# Patient Record
Sex: Female | Born: 1985 | Hispanic: No | Marital: Married | Smoking: Never smoker
Health system: Southern US, Community
[De-identification: ages and names within clinical notes are randomized; demographics above are authoritative.]

## PROBLEM LIST (undated history)

## (undated) DIAGNOSIS — Z789 Other specified health status: Secondary | ICD-10-CM

## (undated) DIAGNOSIS — R519 Headache, unspecified: Secondary | ICD-10-CM

## (undated) DIAGNOSIS — D649 Anemia, unspecified: Secondary | ICD-10-CM

## (undated) HISTORY — DX: Other specified health status: Z78.9

## (undated) HISTORY — PX: NO PAST SURGERIES: SHX2092

---

## 2018-10-03 NOTE — L&D Delivery Note (Addendum)
OB/GYN Faculty Practice Delivery Note  Sara George is a 33 y.o. G1P1001 s/p VAVD at [redacted]w[redacted]d. She was admitted for PROM followed by SOL.   ROM: 16h 62m with clear fluid initially then thick meconium prior to delivery in setting of Triple I GBS Status: positive  Maximum Maternal Temperature: Temp (48hrs), Avg:99.2 F (37.3 C), Min:97.3 F (36.3 C), Max:101.9 F (38.8 C)  Labor Progress: . Admitted with PROM around 10pm 04/09/19 . Augmentation with pitocin . Epidural placement . Triple I - ampicillin and gentamicin, Tylenol, fetal tachycardia noted . IUPC with amnioinfusion started for variable decelerations, fetal tachycardia . Progressed to complete, +1 station with persistent Category II tracing, presentation LOP . Counseled patient on risks/benefits of vacuum-assisted vaginal delivery and patient amenable to plan. Dr. Si Raider present in room for vacuum.  Delivery Date/Time: 04/10/19 at 1422 Delivery: Called to room and patient was complete and pushing. Pushed with patient for about 40 minutes - progressed from +1 to +2 station but with recurrent late/variable decelerations. Recommended vacuum-assisted delivery as noted above and patient in agreement. Mushroom vacuum placed with 1 pop-off noted but good fetal descent. Switched to bell vacuum and able to assist in turning infant to direct OA and pop-off at +3 station. Vacuum removed and patient was able to push and deliver infant with several subsequent contractions. Head delivered direct OA. One loose nuchal cord present, reduced after delivery. Shoulder and body delivered in usual fashion. Infant with spontaneous cry, placed on mother's abdomen, dried and stimulated. Cord clamped x 2 immediately given more respiratory effort and tone, and cut by provider. Infant handed over to awaiting neonatology team. Cord blood and ABG drawn. Placenta delivered spontaneously with gentle cord traction. Fundus firm with massage and Pitocin. Labia, perineum,  vagina, and cervix inspected with 2nd degree perineal laceration. IM methergine given prophylactically given higher PPH risk with triple I.   Placenta: spontaneous, intact, 3-vessel cord (to be sent to pathology given Triple I) Complications: thick meconium, laceration as below, triple I Lacerations: 2nd degree perineal repaired with 3-0 Vicryl and 4-0 Monocryl. Additional laceration repair completed by Dr. Si Raider.  EBL: 134cc per [facility] Analgesia: epidural  Postpartum Planning [x]  message to sent to schedule follow-up  [x]  vaccines UTD  Infant: Vigorous boy  APGARs 7, [place]. Juleen China, DO OB/GYN Fellow, Faculty Practice

## 2018-12-25 ENCOUNTER — Telehealth: Payer: Self-pay | Admitting: Medical

## 2018-12-25 ENCOUNTER — Other Ambulatory Visit (HOSPITAL_COMMUNITY)
Admission: RE | Admit: 2018-12-25 | Discharge: 2018-12-25 | Disposition: A | Discharge status: Home / Self Care | Payer: Medicaid Other | Source: Ambulatory Visit | Attending: Family Medicine | Admitting: Family Medicine

## 2018-12-25 ENCOUNTER — Ambulatory Visit (INDEPENDENT_AMBULATORY_CARE_PROVIDER_SITE_OTHER): Payer: Medicaid Other | Admitting: *Deleted

## 2018-12-25 ENCOUNTER — Other Ambulatory Visit: Payer: Self-pay

## 2018-12-25 VITALS — BP 118/68 | HR 93 | Ht 63.78 in | Wt 160.0 lb

## 2018-12-25 DIAGNOSIS — Z3400 Encounter for supervision of normal first pregnancy, unspecified trimester: Secondary | ICD-10-CM

## 2018-12-25 DIAGNOSIS — Z789 Other specified health status: Secondary | ICD-10-CM

## 2018-12-25 DIAGNOSIS — Z23 Encounter for immunization: Secondary | ICD-10-CM | POA: Diagnosis not present

## 2018-12-25 DIAGNOSIS — Z603 Acculturation difficulty: Secondary | ICD-10-CM

## 2018-12-25 NOTE — Telephone Encounter (Signed)
Called patient with interpreter, but did not get an answer.

## 2018-12-25 NOTE — Progress Notes (Signed)
New Ob intake completed and pregnancy information packet given. Interpreter [place] present for encounter however pt speaks and understands English quite well. Entire interview was performed in [place]. Pt states she would feel more comfortable having interpreter available for visits in case needed. Pt has received some prenatal care in [place] and has limited information available on her phone - nothing printed. Labs done and [place] for anatomy scheduled. Initial prenatal visit w/provider scheduled on 4/7.

## 2018-12-26 ENCOUNTER — Encounter: Payer: Self-pay | Admitting: Medical

## 2018-12-26 DIAGNOSIS — O99019 Anemia complicating pregnancy, unspecified trimester: Secondary | ICD-10-CM | POA: Insufficient documentation

## 2018-12-26 LAB — GC/CHLAMYDIA PROBE AMP (~~LOC~~) NOT AT ARMC
Chlamydia: NEGATIVE
Neisseria Gonorrhea: NEGATIVE

## 2018-12-26 LAB — OBSTETRIC PANEL, INCLUDING HIV
Antibody Screen: NEGATIVE
BASOS: 0 %
Basophils Absolute: 0 10*3/uL (ref 0.0–0.2)
EOS (ABSOLUTE): 0.1 10*3/uL (ref 0.0–0.4)
Eos: 1 %
HIV Screen 4th Generation wRfx: NONREACTIVE
Hematocrit: 28.5 % — ABNORMAL LOW (ref 34.0–46.6)
Hemoglobin: 9.7 g/dL — ABNORMAL LOW (ref 11.1–15.9)
Hepatitis B Surface Ag: NEGATIVE
Immature Grans (Abs): 0.1 10*3/uL (ref 0.0–0.1)
Immature Granulocytes: 1 %
Lymphocytes Absolute: 2 10*3/uL (ref 0.7–3.1)
Lymphs: 21 %
MCH: 27.6 pg (ref 26.6–33.0)
MCHC: 34 g/dL (ref 31.5–35.7)
MCV: 81 fL (ref 79–97)
Monocytes Absolute: 0.9 10*3/uL (ref 0.1–0.9)
Monocytes: 10 %
Neutrophils Absolute: 6.5 10*3/uL (ref 1.4–7.0)
Neutrophils: 67 %
Platelets: 227 10*3/uL (ref 150–450)
RBC: 3.52 x10E6/uL — ABNORMAL LOW (ref 3.77–5.28)
RDW: 14 % (ref 11.7–15.4)
RPR Ser Ql: NONREACTIVE
Rh Factor: POSITIVE
Rubella Antibodies, IGG: 15.1 index (ref >0.99)
WBC: 9.6 10*3/uL (ref 3.4–10.8)

## 2018-12-27 DIAGNOSIS — Z789 Other specified health status: Secondary | ICD-10-CM | POA: Insufficient documentation

## 2018-12-27 LAB — CULTURE, OB URINE

## 2018-12-27 LAB — URINE CULTURE, OB REFLEX

## 2018-12-28 NOTE — Progress Notes (Signed)
I have reviewed the chart and agree with nursing staff's documentation of this patient's encounter.  Vonzella Nipple, PA-C 12/28/2018 8:46 AM

## 2019-01-01 ENCOUNTER — Telehealth: Payer: Self-pay | Admitting: Family Medicine

## 2019-01-01 NOTE — Telephone Encounter (Signed)
Left a message about appt change due to the office wont be open

## 2019-01-07 ENCOUNTER — Ambulatory Visit (INDEPENDENT_AMBULATORY_CARE_PROVIDER_SITE_OTHER): Payer: Medicaid Other | Admitting: Advanced Practice Midwife

## 2019-01-07 ENCOUNTER — Other Ambulatory Visit (HOSPITAL_COMMUNITY)
Admission: RE | Admit: 2019-01-07 | Discharge: 2019-01-07 | Disposition: A | Discharge status: Home / Self Care | Payer: Medicaid Other | Source: Ambulatory Visit | Attending: Medical | Admitting: Medical

## 2019-01-07 ENCOUNTER — Other Ambulatory Visit: Payer: Self-pay

## 2019-01-07 ENCOUNTER — Encounter: Payer: Self-pay | Admitting: Advanced Practice Midwife

## 2019-01-07 VITALS — BP 110/67 | HR 89 | Temp 98.7°F | Wt 158.6 lb

## 2019-01-07 DIAGNOSIS — Z3A26 26 weeks gestation of pregnancy: Secondary | ICD-10-CM

## 2019-01-07 DIAGNOSIS — Z3402 Encounter for supervision of normal first pregnancy, second trimester: Secondary | ICD-10-CM | POA: Insufficient documentation

## 2019-01-07 DIAGNOSIS — Z23 Encounter for immunization: Secondary | ICD-10-CM

## 2019-01-07 MED ORDER — FAMOTIDINE 40 MG PO TABS: 40.0000 mg | ORAL_TABLET | Freq: Every day | ORAL | 11 refills | Status: DC

## 2019-01-07 MED ORDER — DOCUSATE SODIUM 100 MG PO CAPS: 100.0000 mg | ORAL_CAPSULE | Freq: Two times a day (BID) | ORAL | 11 refills | Status: DC | PRN

## 2019-01-07 MED ORDER — PRENATAL VITAMIN PLUS LOW IRON 27-1 MG PO TABS: 1.0000 | ORAL_TABLET | Freq: Every day | ORAL | 11 refills | Status: AC

## 2019-01-07 MED ORDER — FERROUS SULFATE 325 (65 FE) MG PO TABS: 325.0000 mg | ORAL_TABLET | Freq: Two times a day (BID) | ORAL | 11 refills | Status: AC

## 2019-01-07 NOTE — Progress Notes (Signed)
Medicaid Home form Completed-01/07/2019

## 2019-01-07 NOTE — Progress Notes (Signed)
Patient ID: Sara George, female   DOB: 1985-10-20, 33 y.o.   MRN: [id number]  Subjective:   Sara George is a 33 y.o. G1P0 at [redacted]w[redacted]d by LMP being seen today for her first obstetrical visit.  Her obstetrical history is significant for none. Patient does intend to breast feed. Pregnancy history fully reviewed.  Patient reports heartburn and vaginal irritation.  HISTORY: OB History  Gravida Para Term Preterm AB Living  1 0 0 0 0 0  SAB TAB Ectopic Multiple Live Births  0 0 0 0 0    # Outcome Date GA Lbr Len/2nd Weight Sex Delivery Anes PTL Lv  1 Current             Last pap smear was done never according to patient.    Past Medical History:  Diagnosis Date  . Medical history non-contributory    Past Surgical History:  Procedure Laterality Date  . NO PAST SURGERIES     Family History  Problem Relation Age of Onset  . Diabetes Mother   . Hypertension Mother   . Stroke Father    Social History   Tobacco Use  . Smoking status: Never Smoker  . Smokeless tobacco: Never Used  Substance Use Topics  . Alcohol use: Never    Frequency: Never  . Drug use: Never   No Known Allergies Current Outpatient Medications on File Prior to Visit  Medication Sig Dispense Refill  . Prenatal Vit-Fe Fumarate-FA (PRENATAL VITAMINS) 28-0.8 MG TABS Take by mouth.     No current facility-administered medications on file prior to visit.     Review of Systems Pertinent items noted in HPI and remainder of comprehensive ROS otherwise negative.  Exam   Vitals:   01/07/19 1402  BP: 110/67  Pulse: 89  Temp: 98.7 F (37.1 C)  Weight: 158 lb 9.6 oz (71.9 kg)   Fetal Heart Rate (bpm): 146  Physical Exam Vitals signs and nursing note reviewed. Exam conducted with a chaperone present.  Constitutional:      General: She is not in acute distress. HENT:     Head: Normocephalic.  Cardiovascular:     Rate and Rhythm: Normal rate.  Pulmonary:     Effort: Pulmonary effort is normal.   Abdominal:     Comments: Gravid FH 26 cm   Genitourinary:    Comments: External: no lesion Vagina: small amount of white discharge Cervix: pink, smooth, no CMT Uterus: AGA  Neurological:     General: No focal deficit present.     Mental Status: She is alert.  Psychiatric:        Mood and Affect: Mood normal.     Assessment:   Pregnancy: G1P0 Patient Active Problem List   Diagnosis Date Noted  . Language barrier 12/27/2018  . Anemia affecting pregnancy 12/26/2018  . Supervision of low-risk first pregnancy 12/25/2018     Plan:  1. Encounter for supervision of low-risk first pregnancy in second trimester  - Inheritest(R) CF/SMA Panel - Tdap vaccine greater than or equal to 7yo IM - Genetic Screening - Cytology - PAP( [place])   - Cervicovaginal ancillary only( [place]) - RX provided today for prenatal vitamins, iron supplement, colace and pepcid - Will treat vulvar irritation based on results from wet prep, pt ok with this plan.    Initial labs reviewed  Continue prenatal vitamins. Genetic Screening discussed, NIPS: ordered. Ultrasound discussed; fetal anatomic survey: ordered. Problem list reviewed and updated. The nature of [facility] with multiple MDs and other Advanced Practice Providers was explained to patient; also emphasized that residents, students are part of our team. Routine obstetric precautions reviewed. 50% of 45 min visit spent in counseling and coordination of care. Return in about 3 weeks (around 01/28/2019) for GTT and 28 week labs .   Thressa Sheller DNP, CNM  01/07/19  3:08 PM

## 2019-01-07 NOTE — Patient Instructions (Signed)
AREA PEDIATRIC/FAMILY PRACTICE PHYSICIANS  [place] ([address]) . [facility] o Chambliss, MD; Eniola, MD; Hale, MD; Hensel, MD; McDiarmid, MD; McIntyer, MD; Gene Glazebrook, MD; Walden, MD o [address] [place], [place] [address] o [phone] o Mon-Fri 8:30-12:30, 1:30-5:00 o Providers come to see babies at [facility] o Accepting Medicaid . [facility] o Limited providers who accept newborns: Koirala, MD; Morrow, MD; Wolters, MD o [address], [place], [place] [address] o [phone] o Mon-Fri 8:00-5:30 o Babies seen by providers at [facility] o Does NOT accept Medicaid o Please call early in hospitalization for appointment (limited availability)  . [facility] o Mulberry, MD o [address] [place], [place] [address] o [phone] o Mon, Tue, Thur, Fri 8:30-5:00, Wed 10:00-7:00 (closed 1-2pm) o Babies seen by [facility] providers o Accepting Medicaid . [facility] o Rubin, MD o [address], [place], [place] [address] o [phone] o Mon-Fri 8:30-5:00, Sat 8:30-12:00 o Provider comes to see babies at [facility] o Accepting Medicaid o Must have been referred from current patients or contacted office prior to delivery . [facility] ([facility]) o Brown, MD; Chandler, MD; Ettefagh, MD; Grant, MD; Lester, MD; McCormick, MD; McQueen, MD; Prose, MD; Simha, MD; Stanley, MD; Stryffeler, NP; Tebben, NP o [address], [place], [place] [address] o [phone] o Mon, Tue, Thur, Fri 8:30-5:30, Wed 9:30-5:30, Sat 8:30-12:30 o Babies seen by [facility] providers o Accepting Medicaid o Only accepting infants of first-time parents or siblings of current patients o Hospital discharge coordinator will make follow-up appointment . Jack Amos o [address],  [place], [place]  [address] o [phone]   Fax - [phone] . [facility] o [address], [place], [place]  [address] o Phone - [phone]   Fax - [phone] . Shilpa Gosrani o [address], [place], [place]  [address] o [phone]  [place] ([address]) . [facility] o Bates, MD; Brassfield, MD; Cooper, Cox, MD; MD; Davis, MD; Dovico, MD; Ettefaugh, MD; Little, MD; Lowe, MD; Keiffer, MD; Melvin, MD; Sumner, MD; Williams, MD o [address], [place], [place] [address] o [phone] o Mon-Fri 8:30-5:00 (extended evenings Mon-Thur as needed), Sat-Sun 10:00-1:00 o Providers come to see babies at [facility] o Accepting Medicaid for families of first-time babies and families with all children in the household age 3 and under. Must register with office prior to making appointment (M-F only). . [facility] o Henson, NP; Knapp, MD; Lalonde, MD; Tysinger, PA o [address] [place], [place] [address] o [phone] o Mon-Fri 8:00-5:00 o Babies seen by providers at [facility] o Does NOT accept Medicaid/Commercial Insurance Only . [facility] ([facility]  o Artis, MD; Barnes, MD; Bratton, MD; Coccaro, MD; Lockett Gardner, MD; Kramer, MD; Marshall, MD; Netherton, MD; Poleto, MD; Skinner, MD o [address] [place],  [address] o [phone] o Mon-Fri 8:30-5:30, Sat (Oct.-Mar.) 9:00-1:00 o Babies seen by providers at [facility] o Accepting Medicaid  [place] ([address]) . [facility] o Reid, MD; Warner, MD o [address], [place],  [address] o [phone] o Mon-Fri 8:30-5:00, Sat 8:30-12:00 o Providers come to see babies at [facility] o Does NOT accept Medicaid . [facility] o Becker, PA; Hagler, MD; Scifres, PA; Sun, MD; Swayne, MD o [address],  [place], [place] [address] o [phone] o Mon-Fri 8:00-5:00 o Babies seen by providers at [facility] o Does NOT accept Medicaid o Only accepting babies of parents who are patients o Please call early in hospitalization for appointment (limited availability) . [facility] o Clark, MD; Frye, MD; Kelleher, MD; Mack, NP; Miller, MD; O'Keller, MD; Patterson, NP; Pudlo, MD; Puzio, MD; Thomas, MD; Tucker, MD; Twiselton, MD o [address], [place], [place] [address] o [phone] o Mon-Fri 8:00-5:00, Sat 9:00-12:00 o Providers come to see babies at [facility] o Does NOT accept Medicaid  [place] ([address]) . [facility] o Limited providers accepting new patients: Brake, NP; Wharton, PA o [address], [place], [place] [address] o [phone] o Mon-Fri 8:00-5:00 o Babies seen by providers at [facility] o Does NOT accept Medicaid o Only accepting babies of parents who are patients o Please call early in hospitalization for appointment (limited availability) . [facility] o Gay, MD; Quinlan, MD o [address] [place], [place] [address] o [phone] (press 1 to schedule appointment) o Mon-Fri 8:00-5:00 o Providers come to see babies at [facility] o Does NOT accept Medicaid . [facility] o Mazer, MD o [address] [place], [place] [address] o [phone] o Mon-Fri 8:30-5:00 (lunch 12:30-1:00), extended hours by appointment only Wed 5:00-6:30 o Babies seen by [facility] providers o Accepting Medicaid . [facility] o Banks, MD; Jordan, MD; Koberlein, MD o [address], [place], [place] [address] o [phone] o Mon-Fri 8:00-5:00 o Babies seen by [facility] providers o Does NOT accept Medicaid . [facility] o Parker, MD; Hunter, MD; Wallace, DO o [address] [place], [place]  [address] o [phone] o Mon-Fri 8:00-5:00 o Babies seen by [facility] providers o Does NOT accept Medicaid . [facility] o Brandon, PA; Brecken, PA; Christy, NP; Dees, MD; DeClaire, MD; DeWeese, MD; Hansen, NP; Mills, NP; Parrish, NP; Smoot, NP; Summer, MD; Vapne, MD o [address] [place], [place] [address] o [phone] o Mon-Fri 8:30-5:00, Sat 10:00-1:00 o Providers come to see babies at [facility] o Does NOT accept Medicaid o Free prenatal information session Tuesdays at 4:45pm . [facility] o Bouska, MD; Gordon, PA; Jeffery, PA; Weber, PA o [address] [place] [address] o [phone] o Mon-Fri 7:30-5:30 o Babies seen by [facility] providers . [facility] o [address], [place], [place]  [address] o [phone]   Fax - [phone]  [place] ([address] & [address]) . [facility] o [place], [place] o [address] [place], [place] [address] o [phone] o Mon-Thur 8:00-6:00 o Providers come to see babies at [facility] o Accepting Medicaid . [facility] o Anderson, NP; Badger, MD; Beal, PA; Spencer, PA o [address] [place], [place] [address] o [phone] o Mon-Thur 7:30-7:30, Fri 7:30-4:30 o Babies seen by [facility] providers o Accepting Medicaid . [facility] o Agbuya, MD; Klett, NP; Romgoolam, MD o [address], , [place] [address] o [phone] o Mon-Fri 8:30-5:00, Sat 8:30-12:00 o Providers come to see babies at [facility] o Accepting Medicaid o Must have "Meet & Greet" appointment at office prior to delivery . [facility]  [facility] o McCord,   MD; Wallace, MD; Wood, MD o [address], [place], [place] [address] o [phone] o Mon-Wed 8:00-6:00, Thur-Fri 8:00-5:00, Sat 9:00-12:00 o Providers come to  see babies at [facility] o Does NOT accept Medicaid o Only accepting siblings of current patients . [facility]  o [address], [place], [place]  [address] o [phone]   Fax - [phone] . [facility] o [address], [place], [place]  [address] o [phone]   Fax - [phone]  [place] ([address] & [address]) . [facility] o Cirigliano, DO; Matthews, DO o [address] , [place] [address] o [phone] o Mon-Fri 7:00-5:00 o Babies seen by [facility] providers o Does NOT accept Medicaid . [facility] o Briscoe, MD; Howley, PA; Moreira, PA o [address], [place], [place] [address] o [phone] o Mon-Fri 8:00-5:00 o Babies seen by [facility] providers o Accepting Medicaid . [facility] o Boyd, MD; Church, PA; Jones, NP; Osborn, PA o [address], , [place] [address] o [phone] o Mon-Fri 8:00-5:00 o Babies seen by providers at [facility] o Accepting Medicaid  [place] ([address]) . [facility] [place] o Wendling, DO o [address] [place], [place] [address] o [phone] o Mon-Fri 8:00-5:00 o Babies seen by [facility] providers o Does NOT accept Medicaid o Limited availability, please call early in hospitalization to schedule follow-up . [facility] o Calderon, PA; Cummings, MD; Dillard, MD; Martin, PA; Olson, MD; VanDeven, PA o [address], [place], [place] [address] o [phone] o Mon-Fri 8:30-5:00, Sat 9:00-12:00 o Babies seen by providers at [facility] o Accepting Medicaid o Please register online then schedule online or call office o [email or web address] . [facility] o Hunter, NP; Kumar, MD; Martin Rogers, PA o [address], [place], [place] [address] o [phone] o Mon-Fri 8:00-5:00 o Babies seen by providers at [facility] o Accepting Medicaid . [facility] o Ple[place]on, MD; Kristi Fleenor, NP; West, MD o [address], [place], [place] [address] o [phone] o Mon-Fri 8:00-5:30, Sat&Sun by appointment (phones open at 8:30) o Babies seen by [facility] providers o Accepting Medicaid o Must be a first-time baby or sibling of current patient . [facility] [place]  o [address], [place], [place]  [address] o [phone]   Fax - [phone]  [place] ([address]) . [facility] o Brown, PA; Cowen, PA; Rice, MD; Helton, PA; Spry, MD o [address] [place], [place] [address] o [phone] o Mon-Thur 8:00-7:00, Fri 8:00-5:00, Sat 8:00-12:00, Sun 9:00-12:00 o Babies seen by [facility] providers o Accepting Medicaid . [facility] [place] o Coe-Goins, MD; Marshall, MD; Pierre-Louis, MD o [address] [place] [address], [place], [place] [address] o [phone] o Mon-Thur 8:00-5:00 o Babies seen by providers at [facility] o Accepting Medicaid . [facility] o Bratton, MD; Coe-Goins, MD; Hayes, MD; Lewis, MD; List, MD; Lott, MD; Marshall, MD; Moran, MD; O'Shelie Lansing, MD; Pierre-Louis, MD; Pitonzo, MD; Scholer, MD; Spangle, MD o [address] [place],    [address] o [phone] o Mon-Fri 8:00-5:30, Sat (Oct.-Mar.) 9:00-1:00 o Babies seen by providers at [facility] o Accepting Medicaid o Must fill out new patient packet, available online at [email or web address] . [facility] ([facility] o Friddle, NP; Harris, NP; Kelly, NP; Logan, MD;  Melvin, PA; Poth, MD; Ramadoss, MD; Stanton, NP o [address], [place], [place] [address] o [phone] o Mon-Thur 8:00-5:30, Fri 8:00-5:00 o Babies seen by providers at [facility] o Accepting Medicaid  [place] ([address]) . [facility] o Dixon, PA; [place], MD; Pickard, MD; Tapia, PA o [address], [place], [place] [address] o [phone] o Mon-Fri 8:00-5:00 o Babies seen by providers at [facility] o Accepting Medicaid   [place] ([address]) . [facility] [place] o Masneri, DO; Meyers, MD; Nelson, PA o [address], [place], [place] [address] o [phone] o Mon-Fri 8:00-5:00 o Babies seen by providers at [facility] o Does NOT accept Medicaid o Limited appointment availability, please call early in hospitalization  . [facility] [place] o Kunedd, DO; McGowen, MD o [address], [place], [place] [address] o [phone] o Mon-Fri 8:00-5:00 o Babies seen by [facility] providers o Does NOT accept Medicaid . [facility] [place] o Cameron, MD; MacDonald, MD; Michaels, PA; Nayak, MD o [address] [place] [address], [place], [place] [address] o [phone] o Mon-Fri 8:00-5:00 o After hours clinic ([address] [place], [place] [address]) [phone] Mon-Fri 5:00-8:00, Sat 12:00-6:00, Sun 10:00-4:00 o Babies seen by [facility] providers o Accepting Medicaid . [facility] [place] o [address], [place], [place]  [address] o [phone]   Fax - [phone]  [place] ([address]) . [facility] [place] [facility] o Andy, MD o [address], [place], [place] [address] o [phone] o Mon-Fri 8:00-5:00 o Babies seen by [facility] providers o Does NOT accept Medicaid . [facility] [place] ([facility] [place]) o Eksir, MD o [address], [place], [place]  [address] o [phone] o Mon-Thur 8:00-7:00, Fri 8:00-5:00, Sat 8:00-12:00 o Babies seen by providers at [facility] o Accepting Medicaid - but does not have vaccinations in office (must be received elsewhere) o Limited availability, please call early in hospitalization  [place] ([address]) . [facility]  o Charlene Flemming, MD o [address], [place] [address] o [phone]  Fax [phone] Places to have your son circumcised:                                                                      [facility]     [phone]   $480 while you are in hospital         [facility]              [phone]   $269 by 4 wks                      [facility]                     [phone]   $  269 by 7 days [facility]                    [phone]   $269 by 4 wks [facility]             [phone]   $175 by 2 wks    These prices sometimes change but are roughly what you can expect to pay. Please call and confirm pricing.   Circumcision is considered an elective/non-medically necessary procedure. There are many reasons parents decide to have their sons circumsized. During the first year of life circumcised males have a reduced risk of urinary tract infections but after this year the rates between circumcised males and uncircumcised males are the same.  It is safe to have your son circumcised outside of the hospital and the places above perform them regularly.   Deciding about Circumcision in Baby Boys  (Up-to-date The Basics)  What is circumcision?   Circumcision is a surgery that removes the skin that covers the tip of the penis, called the "foreskin" Circumcision is usually done when a boy is between 1 and 10 days old. In the [place], circumcision is common. In some other countries, fewer boys are circumcised. Circumcision is a common tradition in some religions.  Should I have my baby boy circumcised?   There is no easy answer. Circumcision has some benefits. But it also has risks.  After talking with your doctor, you will have to decide for yourself what is right for your family.  What are the benefits of circumcision?   Circumcised boys seem to have slightly lower rates of: ?Urinary tract infections ?Swelling of the opening at the tip of the penis Circumcised men seem to have slightly lower rates of: ?Urinary tract infections ?Swelling of the opening at the tip of the penis ?Penis cancer ?HIV and other infections that you catch during sex ?Cervical cancer in the women they have sex with Even so, in the [place], the risks of these problems are small - even in boys and men who have not been circumcised. Plus, boys and men who are not circumcised can reduce these extra risks by: ?Cleaning their penis well ?Using condoms during sex  What are the risks of circumcision?  Risks include: ?Bleeding or infection from the surgery ?Damage to or amputation of the penis ?A chance that the doctor will cut off too much or not enough of the foreskin ?A chance that sex won't feel as good later in life Only about 1 out of every 200 circumcisions leads to problems. There is also a chance that your health insurance won't pay for circumcision.  How is circumcision done in baby boys?  First, the baby gets medicine for pain relief. This might be a cream on the skin or a shot into the base of the penis. Next, the doctor cleans the baby's penis well. Then he or she uses special tools to cut off the foreskin. Finally, the doctor wraps a bandage (called gauze) around the baby's penis. If you have your baby circumcised, his doctor or nurse will give you instructions on how to care for him after the surgery. It is important that you follow those instructions carefully.  

## 2019-01-07 NOTE — Progress Notes (Signed)
Pt is having heartburn. 

## 2019-01-08 ENCOUNTER — Encounter: Payer: Self-pay | Admitting: *Deleted

## 2019-01-08 ENCOUNTER — Ambulatory Visit (HOSPITAL_COMMUNITY)
Admission: RE | Admit: 2019-01-08 | Payer: Medicaid Other | Source: Ambulatory Visit | Attending: Medical | Admitting: Medical

## 2019-01-08 ENCOUNTER — Ambulatory Visit (HOSPITAL_COMMUNITY): Payer: Medicaid Other

## 2019-01-08 ENCOUNTER — Encounter: Payer: Self-pay | Admitting: Medical

## 2019-01-08 LAB — CERVICOVAGINAL ANCILLARY ONLY
Bacterial vaginitis: NEGATIVE
Candida vaginitis: POSITIVE — AB
Trichomonas: NEGATIVE

## 2019-01-09 LAB — CYTOLOGY - PAP
Diagnosis: NEGATIVE
HPV: NOT DETECTED

## 2019-01-09 MED ORDER — TERCONAZOLE 0.4 % VA CREA: 1.0000 | TOPICAL_CREAM | Freq: Every day | VAGINAL | 0 refills | Status: DC

## 2019-01-09 NOTE — Addendum Note (Signed)
Addended by: Thressa Sheller D on: 01/09/2019 03:06 PM   Modules accepted: Orders

## 2019-01-17 ENCOUNTER — Ambulatory Visit (HOSPITAL_COMMUNITY)
Admission: RE | Admit: 2019-01-17 | Discharge: 2019-01-17 | Disposition: A | Discharge status: Home / Self Care | Payer: Medicaid Other | Source: Ambulatory Visit | Attending: Medical | Admitting: Medical

## 2019-01-17 ENCOUNTER — Other Ambulatory Visit: Payer: Self-pay

## 2019-01-17 DIAGNOSIS — Z3400 Encounter for supervision of normal first pregnancy, unspecified trimester: Secondary | ICD-10-CM | POA: Diagnosis not present

## 2019-01-17 DIAGNOSIS — O0932 Supervision of pregnancy with insufficient antenatal care, second trimester: Secondary | ICD-10-CM

## 2019-01-17 DIAGNOSIS — Z3A27 27 weeks gestation of pregnancy: Secondary | ICD-10-CM | POA: Diagnosis not present

## 2019-01-17 DIAGNOSIS — Z363 Encounter for antenatal screening for malformations: Secondary | ICD-10-CM

## 2019-01-18 LAB — INHERITEST(R) CF/SMA PANEL

## 2019-01-21 ENCOUNTER — Other Ambulatory Visit: Payer: Medicaid Other

## 2019-01-21 ENCOUNTER — Other Ambulatory Visit: Payer: Self-pay

## 2019-01-21 ENCOUNTER — Encounter: Payer: Self-pay | Admitting: *Deleted

## 2019-01-21 DIAGNOSIS — Z3403 Encounter for supervision of normal first pregnancy, third trimester: Secondary | ICD-10-CM

## 2019-01-22 LAB — RPR: RPR Ser Ql: NONREACTIVE

## 2019-01-22 LAB — GLUCOSE TOLERANCE, 2 HOURS W/ 1HR
Glucose, 1 hour: 129 mg/dL (ref 65–179)
Glucose, 2 hour: 96 mg/dL (ref 65–152)
Glucose, Fasting: 82 mg/dL (ref 65–91)

## 2019-01-22 LAB — CBC
Hematocrit: 32.5 % — ABNORMAL LOW (ref 34.0–46.6)
Hemoglobin: 10.4 g/dL — ABNORMAL LOW (ref 11.1–15.9)
MCH: 27.5 pg (ref 26.6–33.0)
MCHC: 32 g/dL (ref 31.5–35.7)
MCV: 86 fL (ref 79–97)
Platelets: 206 10*3/uL (ref 150–450)
RBC: 3.78 x10E6/uL (ref 3.77–5.28)
RDW: 14.5 % (ref 11.7–15.4)
WBC: 9.4 10*3/uL (ref 3.4–10.8)

## 2019-01-22 LAB — HIV ANTIBODY (ROUTINE TESTING W REFLEX): HIV Screen 4th Generation wRfx: NONREACTIVE

## 2019-01-24 ENCOUNTER — Encounter: Payer: Self-pay | Admitting: *Deleted

## 2019-01-28 ENCOUNTER — Other Ambulatory Visit: Payer: Self-pay

## 2019-01-28 ENCOUNTER — Telehealth: Payer: Self-pay | Admitting: Advanced Practice Midwife

## 2019-01-28 ENCOUNTER — Encounter: Payer: Self-pay | Admitting: Advanced Practice Midwife

## 2019-01-28 ENCOUNTER — Ambulatory Visit (INDEPENDENT_AMBULATORY_CARE_PROVIDER_SITE_OTHER): Payer: Medicaid Other | Admitting: Advanced Practice Midwife

## 2019-01-28 DIAGNOSIS — Z3403 Encounter for supervision of normal first pregnancy, third trimester: Secondary | ICD-10-CM

## 2019-01-28 DIAGNOSIS — Z3A29 29 weeks gestation of pregnancy: Secondary | ICD-10-CM | POA: Diagnosis not present

## 2019-01-28 DIAGNOSIS — Z3400 Encounter for supervision of normal first pregnancy, unspecified trimester: Secondary | ICD-10-CM

## 2019-01-28 NOTE — Progress Notes (Signed)
   TELEHEALTH VIRTUAL OBSTETRICS VISIT ENCOUNTER NOTE  I connected with Sara George on 01/28/19 at  3:15 PM EDT by telephone at home and verified that I am speaking with the correct person using two identifiers.   I discussed the limitations, risks, security and privacy concerns of performing an evaluation and management service by telephone and the availability of in person appointments. I also discussed with the patient that there may be a patient responsible charge related to this service. The patient expressed understanding and agreed to proceed.  Subjective:  Sara George is a 33 y.o. G1P0 at [redacted]w[redacted]d being followed for ongoing prenatal care.  She is currently monitored for the following issues for this low-risk pregnancy and has Supervision of low-risk first pregnancy; Anemia affecting pregnancy; and Language barrier on their problem list.  Patient reports no complaints. Reports fetal movement. Denies any contractions, bleeding or leaking of fluid.   The following portions of the patient's history were reviewed and updated as appropriate: allergies, current medications, past family history, past medical history, past social history, past surgical history and problem list.   Objective:   General:  Alert, oriented and cooperative.   Mental Status: Normal mood and affect perceived. Normal judgment and thought content.  Rest of physical exam deferred due to type of encounter  Assessment and Plan:  Pregnancy: G1P0 at [redacted]w[redacted]d 1. Encounter for supervision of low-risk first pregnancy, antepartum - Blood pressure today with home cuff: 11/74 - Patient is taking iron supplement.   Preterm labor symptoms and general obstetric precautions including but not limited to vaginal bleeding, contractions, leaking of fluid and fetal movement were reviewed in detail with the patient.  I discussed the assessment and treatment plan with the patient. The patient was provided an opportunity to ask  questions and all were answered. The patient agreed with the plan and demonstrated an understanding of the instructions. The patient was advised to call back or seek an in-person office evaluation/go to [facility] at [facility] for any urgent or concerning symptoms. Please refer to After Visit Summary for other counseling recommendations.   I provided 15 minutes of non-face-to-face time during this encounter. With interpretor   Return in about 4 weeks (around 02/25/2019) for video visit with interpretor .  Future Appointments  Date Time Provider Department Center  01/28/2019  3:15 PM Armando Reichert, CNM [facility]   Thressa Sheller DNP, CNM  01/28/19  3:14 PM  Center for [employer], [facility]

## 2019-01-28 NOTE — Telephone Encounter (Signed)
Attempted to call patient with Arabic interpret ID # [id number] to get her scheduled for her next office visit. No answer left appointment date and time (5/26 @ 3:55pm) on voicemail. Patient informed that this will be a virtual visit.

## 2019-01-28 NOTE — Progress Notes (Signed)
Pt reports a blood pressure of 111/74 during virtual visit.

## 2019-01-29 NOTE — Telephone Encounter (Signed)
Opened in error

## 2019-02-26 ENCOUNTER — Other Ambulatory Visit: Payer: Self-pay

## 2019-02-26 ENCOUNTER — Telehealth: Payer: Self-pay | Admitting: Nurse Practitioner

## 2019-02-26 ENCOUNTER — Ambulatory Visit (INDEPENDENT_AMBULATORY_CARE_PROVIDER_SITE_OTHER): Payer: Medicaid Other | Admitting: Nurse Practitioner

## 2019-02-26 VITALS — BP 110/72 | HR 77 | Wt 159.4 lb

## 2019-02-26 DIAGNOSIS — O99019 Anemia complicating pregnancy, unspecified trimester: Secondary | ICD-10-CM

## 2019-02-26 DIAGNOSIS — Z789 Other specified health status: Secondary | ICD-10-CM

## 2019-02-26 DIAGNOSIS — Z3A33 33 weeks gestation of pregnancy: Secondary | ICD-10-CM

## 2019-02-26 DIAGNOSIS — Z3400 Encounter for supervision of normal first pregnancy, unspecified trimester: Secondary | ICD-10-CM

## 2019-02-26 NOTE — Progress Notes (Signed)
I connected with@ on 02/26/19 at  3:55 PM EDT by: WebEx meeting with interpreter in attendance and verified that I am speaking with the correct person using two identifiers.  Patient is located at home and provider is located at [place] office.     The purpose of this virtual visit is to provide medical care while limiting exposure to the novel coronavirus. I discussed the limitations, risks, security and privacy concerns of performing an evaluation and management service by Nolene Bernheim, NP and the availability of in person appointments. I also discussed with the patient that there may be a patient responsible charge related to this service. By engaging in this virtual visit, you consent to the provision of healthcare.  Additionally, you authorize for your insurance to be billed for the services provided during this visit.  The patient expressed understanding and agreed to proceed.  The following staff members participated in the virtual visit:  Nolene Bernheim, NP    PRENATAL VISIT NOTE  Subjective:  Sara George is a 33 y.o. G1P0 at [redacted]w[redacted]d  for phone visit for ongoing prenatal care.  She is currently monitored for the following issues for this low-risk pregnancy and has Supervision of low-risk first pregnancy; Anemia affecting pregnancy; and Language barrier on their problem list.  Patient reports one episode of leaking white fluid 2 weeks ago.  None this week and no fever or cough.  Likely is vaginal discharge since she has no fever at this time.  Noted she has been treated for yeast in the past during this pregnancy but currently reports no itching or burning in the genital area or inside.  Contractions: Not present. Vag. Bleeding: None.  Movement: Present. Denies leaking of fluid.   The following portions of the patient's history were reviewed and updated as appropriate: allergies, current medications, past family history, past medical history, past social history, past surgical history and  problem list.   Objective:   Vitals:   02/26/19 1601  BP: 110/72  Pulse: 77  Weight: 159 lb 6.4 oz (72.3 kg)   Self-Obtained  Fetal Status:     Movement: Present     Assessment and Plan:  Pregnancy: G1P0 at [redacted]w[redacted]d 1. Encounter for supervision of low-risk first pregnancy, antepartum Client reports no problems.  Reads English so added pediatrician list and info on Deberah Pelton to her visit AVS today,  Advised her to check on MyChart - she says she has this on her phone.  Reviewed entrance C for admission to the [facility] when she is in labor.  Denies headaches and reports very mild swelling in her feet - normal for this time in pregnancy.  Has pressure only on awakening and it is relieved by urination.  2. Language barrier Interpreter present for all of visit.  3. Anemia affecting pregnancy, antepartum Taking iron supplement along with PNV and stool softener.  Preterm labor symptoms and general obstetric precautions including but not limited to vaginal bleeding, contractions, leaking of fluid and fetal movement were reviewed in detail with the patient.  Return in about 3 weeks (around 03/19/2019) for in person visit for 36 weeks with labs.  Future Appointments  Date Time Provider Department Center  03/19/2019  1:55 PM Judeth Horn, NP [facility]     Time spent on virtual visit: 11 minutes  Currie Paris, NP

## 2019-02-26 NOTE — Patient Instructions (Addendum)
AREA PEDIATRIC/FAMILY PRACTICE PHYSICIANS  [place] ([address]) . [facility] o Chambliss, MD; Eniola, MD; Hale, MD; Hensel, MD; McDiarmid, MD; McIntyer, MD; Neal, MD; Walden, MD o [address] [place], [place] [address] o [phone] o Mon-Fri 8:30-12:30, 1:30-5:00 o Providers come to see babies at [facility] o Accepting Medicaid . [facility] o Limited providers who accept newborns: Koirala, MD; Morrow, MD; Wolters, MD o [address], [place], [place] [address] o [phone] o Mon-Fri 8:00-5:30 o Babies seen by providers at [facility] o Does NOT accept Medicaid o Please call early in hospitalization for appointment (limited availability)  . [facility] o Mulberry, MD o [address] [place], [place] [address] o [phone] o Mon, Tue, Thur, Fri 8:30-5:00, Wed 10:00-7:00 (closed 1-2pm) o Babies seen by [facility] providers o Accepting Medicaid . [facility] - Pediatrician o Rubin, MD o [address], [place], [place] [address] o [phone] o Mon-Fri 8:30-5:00, Sat 8:30-12:00 o Provider comes to see babies at [facility] o Accepting Medicaid o Must have been referred from current patients or contacted office prior to delivery . [facility] ([facility]) o Brown, MD; Chandler, MD; Ettefagh, MD; Grant, MD; Lester, MD; McCormick, MD; McQueen, MD; Prose, MD; Simha, MD; Stanley, MD; Stryffeler, NP; Tebben, NP o [address], [place], [place] [address] o [phone] o Mon, Tue, Thur, Fri 8:30-5:30, Wed 9:30-5:30, Sat 8:30-12:30 o Babies seen by [facility] providers o Accepting Medicaid o Only accepting infants of first-time parents or siblings of current patients o Hospital discharge coordinator will make follow-up appointment . Jack Amos o [address],  [place], [place]  [address] o [phone]   Fax - [phone] . [facility] o [address], [place], [place]  [address] o Phone - [phone]   Fax - [phone] . Shilpa Gosrani o [address], [place], [place]  [address] o [phone]  [place] ([address]) . [facility] o Bates, MD; [place], MD; Cooper, Cox, MD; MD; Davis, MD; Dovico, MD; Ettefaugh, MD; Little, MD; Lowe, MD; Keiffer, MD; Melvin, MD; Sumner, MD; Williams, MD o [address], [place], [place] [address] o [phone] o Mon-Fri 8:30-5:00 (extended evenings Mon-Thur as needed), Sat-Sun 10:00-1:00 o Providers come to see babies at [facility] o Accepting Medicaid for families of first-time babies and families with all children in the household age 3 and under. Must register with office prior to making appointment (M-F only). . [facility] o Henson, NP; Knapp, MD; Lalonde, MD; Tysinger, PA o [address] [place], [place] [address] o [phone] o Mon-Fri 8:00-5:00 o Babies seen by providers at [facility] o Does NOT accept Medicaid/Commercial Insurance Only . [facility] [place] ([facility])  o Artis, MD; Barnes, MD; Bratton, MD; Coccaro, MD; Lockett Gardner, MD; Kramer, MD; Marshall, MD; Netherton, MD; Poleto, MD; Skinner, MD o [address] [place], [place] [address] o [phone] o Mon-Fri 8:30-5:30, Sat (Oct.-Mar.) 9:00-1:00 o Babies seen by providers at [facility] o Accepting Medicaid  [place] ([address]) . [facility] o Reid, MD; Warner, MD o [address], [place],  [address] o [phone] o Mon-Fri 8:30-5:00, Sat 8:30-12:00 o Providers come to see babies at [facility] o Does NOT accept Medicaid . [facility] o Becker, PA; Hagler, MD; Scifres, PA; Sun, MD; Swayne, MD o [address],  [place], [place] [address] o [phone] o Mon-Fri 8:00-5:00 o Babies seen by providers at [facility] o Does NOT accept Medicaid o Only accepting babies of parents who are patients o Please call early in hospitalization for appointment (limited availability) . [facility] o Clark, MD; Frye, MD; Kelleher, MD; Mack, NP; Miller, MD; O'Keller, MD; Patterson, NP; Pudlo, MD; Puzio, MD; Thomas, MD; Tucker, MD; Twiselton, MD o [address], [place], [place] [address] o [phone] o Mon-Fri 8:00-5:00, Sat 9:00-12:00 o Providers come to see babies at [facility] o Does NOT accept Medicaid  [place] ([address]) . [facility] o Limited providers accepting new patients: Brake, NP; Wharton, PA o [address], [place], [place] [address] o [phone] o Mon-Fri 8:00-5:00 o Babies seen by providers at [facility] o Does NOT accept Medicaid o Only accepting babies of parents who are patients o Please call early in hospitalization for appointment (limited availability) . [facility] o Gay, MD; Quinlan, MD o [address] [place], [place] [address] o [phone] (press 1 to schedule appointment) o Mon-Fri 8:00-5:00 o Providers come to see babies at [facility] o Does NOT accept Medicaid . [facility] o Mazer, MD o [address] [place], [place] [address] o [phone] o Mon-Fri 8:30-5:00 (lunch 12:30-1:00), extended hours by appointment only Wed 5:00-6:30 o Babies seen by [facility] providers o Accepting Medicaid . [facility] o Banks, MD; Jordan, MD; Koberlein, MD o [address], [place], [place] [address] o [phone] o Mon-Fri 8:00-5:00 o Babies seen by [facility] providers o Does NOT accept Medicaid . [facility] o Parker, MD; Hunter, MD; Wallace, DO o [address] [place], [place]  [address] o [phone] o Mon-Fri 8:00-5:00 o Babies seen by [facility] providers o Does NOT accept Medicaid . [facility] o Brandon, PA; Brecken, PA; Christy, NP; Dees, MD; DeClaire, MD; DeWeese, MD; Hansen, NP; Mills, NP; Parrish, NP; Smoot, NP; Summer, MD; Vapne, MD o [address] [place], [place] [address] o [phone] o Mon-Fri 8:30-5:00, Sat 10:00-1:00 o Providers come to see babies at [facility] o Does NOT accept Medicaid o Free prenatal information session Tuesdays at 4:45pm . [facility] o Bouska, MD; Gordon, PA; Jeffery, PA; Weber, PA o [address] [place] [address] o [phone] o Mon-Fri 7:30-5:30 o Babies seen by [facility] providers . [facility] o [address], [place], [place], [place]  [address] o [phone]   Fax - [phone]  [place] ([phone]) . [facility] o [place]ese, [place] o [address] [place], [place] [address] o [phone] o Mon-Thur 8:00-6:00 o Providers come to see babies at [facility] o Accepting Medicaid . [facility] o Anderson, NP; Badger, MD; Beal, PA; Spencer, PA o [address] [place],  [address] o [phone] o Mon-Thur 7:30-7:30, Fri 7:30-4:30 o Babies seen by [facility] providers o Accepting Medicaid . [facility] o Agbuya, MD; Klett, NP; Romgoolam, MD o [address], ,  [address] o [phone] o Mon-Fri 8:30-5:00, Sat 8:30-12:00 o Providers come to see babies at [facility] o Accepting Medicaid o Must have "Meet & Greet" appointment at office prior to delivery . [facility]  ([facility] ) o McCord,   MD; Juleen China, MD; Clydene Laming, [place] [address], [place], [place] [address] o [phone] o Mon-Wed 8:00-6:00, Thur-Fri 8:00-5:00, Sat 9:00-12:00 o Providers come to  see babies at [facility] o Does NOT accept Medicaid o Only accepting siblings of current patients . [facility] [place], [place], [place], [place]  [address] o [phone]   Fax [phone] . [facility] [place] [address], [place], [place]  [address] o [phone]   Fax - Morton Gorman [phone] & [phone]) . Therapist, music at [place], DO; [place], [address] [place], [place] [address] o [phone] o Mon-Fri 7:00-5:00 o Babies seen by [facility] providers o Does NOT accept Medicaid . [place], MD; [place], [place]; [place], [place], [place], [place] [address] o [phone] o Mon-Fri 8:00-5:00 o Babies seen by [facility] providers o Accepting Medicaid . Lamont, MD; [place], [place]; [place], NP; [place], [place], [place], [place] [address] o [phone] o Mon-Fri 8:00-5:00 o Babies seen by providers at [place] [phone]) . [facility] [place], [place] o [address] [place], [place] [address] o [phone] o Mon-Fri 8:00-5:00 o Babies seen by [facility] providers o Does NOT accept Medicaid o Limited availability, please call early in hospitalization to schedule follow-up . [facility] Leilani Merl, PA; Maisie Fus, MD; [place], MD; [place], [place]; Jeannine Kitten, MD; [place], [place] [facility] [address], [place], [place] [address] o [phone] o Mon-Fri 8:30-5:00, Sat 9:00-12:00 o Babies seen by providers at [facility] o Accepting Medicaid o Please register online then schedule online or call office o [email or web address] . [place] [facility]  [place]) Kristian Covey, NP; Dwyane Dee, MD; Leonidas Romberg, PA o [address] [place], [place], [place] [address] o [phone] o Mon-Fri 8:00-5:00 o Babies seen by providers at [facility] o Accepting Medicaid . [place] ([facility] [employer]) Dairl Ponder, MD; Rayvon Char, NP; Melina Modena, MD o [address] [place], [place], [place] [address] o [phone] o Mon-Fri 8:00-5:30, Sat&Sun by appointment (phones open at 8:30) o Babies seen by [facility] providers o Accepting Medicaid o Must be a first-time baby or sibling of current patient . [place], [address], [place], [place]  [address] o [phone]   Fax - [phone]  [place] [phone] & [phone]) . [place], [place]; [place], [place]; Benjamine Mola, MD; [place], [place]; Harrell Lark, MD o [address] [place], [place] [address] o [phone] o Mon-Thur 8:00-7:00, Fri 8:00-5:00, Sat 8:00-12:00, Sun 9:00-12:00 o Babies seen by [facility] providers o Accepting Medicaid . [facility] at [facility], MD; Ruthann Cancer, MD; [facility], MD o [address], [place], [place] [address] o [phone] o Mon-Thur 8:00-5:00 o Babies seen by providers at [facility] o Accepting Medicaid . [facility] at [place], MD; Coe[place], MD; Amedeo Plenty, MD; Bobby Rumpf, MD; List, MD; Lavonia Drafts, MD; Ruthann Cancer, MD; Selinda Eon, MD; Audie Box, MD; Jim Like, MD; Christie Nottingham, MD; Hubbard Hartshorn, MD; Modena Nunnery, MD o [address] [place], [place]  [address] o [phone] o Mon-Fri 8:00-5:30, Sat (Oct.-Mar.) 9:00-1:00 o Babies seen by providers at [facility] o Accepting Medicaid o Must fill out new patient packet, available online at [email or web address] . [facility] [place] [facility] at [facility]) Simone Curia, NP; Tiburcio Pea, NP; Tresa Endo, NP; Whitney Post, MD;  [place], [place]; Hennie Duos, MD; Wynne Dust, MD; Kavin Leech, NP o [address], [place], [place] [address] o [phone] o Mon-Thur 8:00-5:30, Fri 8:00-5:00 o Babies seen by providers at [facility] o Accepting [facility] [phone]) . [facility] o [place], [place]; [place], MD; Tanya Nones, MD; [place], [place] o [address] [place], [place] [address] o [phone] o Mon-Fri 8:00-5:00 o Babies seen by providers at [facility] o Accepting [facility] [phone]) . [facility] o Cabool, DO; Lenise Arena, MD; [place], [place] o [address], [place], [place] [address] o [phone] o Mon-Fri 8:00-5:00 o Babies seen by providers at [facility] o Does NOT accept Medicaid o Limited appointment availability, please call early in hospitalization  . Nature conservation officer at [facility] o [place], DO; Ely, MD o [address] [place], [place] [address] o [phone] o Mon-Fri 8:00-5:00 o Babies seen by [facility] providers o Does NOT accept Medicaid . [facility] Lorrine Kin, MD; Ninetta Lights, MD; [place], [place]; [place], MD o [address], [place], [place] [address] o [phone] o Mon-Fri 8:00-5:00 o After hours clinic [facility] [place], [place] [address]) [phone] Mon-Fri 5:00-8:00, Sat 12:00-6:00, Sun 10:00-4:00 o Babies seen by [facility] providers o Accepting Medicaid . [facility] o [address] [place], [place]  [address] o [phone]   Fax - [phone]  [place] [phone]) . Nature conservation officer at [facility], MD o [address] [place], [place], [place] [address] o [phone] o Mon-Fri 8:00-5:00 o Babies seen by [facility] providers o Does NOT accept Medicaid . [facility] - [place] [facility] [place]) Tomi Likens, MD o [address] [place] [address] [place], [place]  [address] o [phone] o Mon-Thur 8:00-7:00, Fri 8:00-5:00, Sat 8:00-12:00 o Babies seen by providers at [facility] o Accepting Medicaid - but does not have vaccinations in office (must be received elsewhere) o Limited availability, please call early in hospitalization   ([phone]) . [facility]  o Wyvonne Lenz, MD o [address], [place] [address] o [phone]  Fax [phone]    Deberah Pelton Contractions Contractions of the uterus can occur throughout pregnancy, but they are not always a sign that you are in labor. You may have practice contractions called Braxton Hicks contractions. These false labor contractions are sometimes confused with true labor. What are Deberah Pelton contractions? Braxton Hicks contractions are tightening movements that occur in the muscles of the uterus before labor. Unlike true labor contractions, these contractions do not result in opening (dilation) and thinning of the cervix. Toward the end of pregnancy (32-34 weeks), Braxton Hicks contractions can happen more often and may become stronger. These contractions are sometimes difficult to tell apart from true labor because they can be very uncomfortable. You should not feel embarrassed if you go to the hospital with false labor. Sometimes, the only way to tell if you are in true labor is for your health care provider to look for changes in the cervix. The health care provider will do  a physical exam and may monitor your contractions. If you are not in true labor, the exam should show that your cervix is not dilating and your water has not broken. If there are no other health problems associated with your pregnancy, it is completely safe for you to be sent home with false labor. You may continue to have Braxton Hicks contractions until you go into true labor. How to tell the difference between true labor and false labor True labor  Contractions last 30-70  seconds.  Contractions become very regular.  Discomfort is usually felt in the top of the uterus, and it spreads to the lower abdomen and low back.  Contractions do not go away with walking.  Contractions usually become more intense and increase in frequency.  The cervix dilates and gets thinner. False labor  Contractions are usually shorter and not as strong as true labor contractions.  Contractions are usually irregular.  Contractions are often felt in the front of the lower abdomen and in the groin.  Contractions may go away when you walk around or change positions while lying down.  Contractions get weaker and are shorter-lasting as time goes on.  The cervix usually does not dilate or become thin. Follow these instructions at home:   Take over-the-counter and prescription medicines only as told by your health care provider.  Keep up with your usual exercises and follow other instructions from your health care provider.  Eat and drink lightly if you think you are going into labor.  If Braxton Hicks contractions are making you uncomfortable: ? Change your position from lying down or resting to walking, or change from walking to resting. ? Sit and rest in a tub of warm water. ? Drink enough fluid to keep your urine pale yellow. Dehydration may cause these contractions. ? Do slow and deep breathing several times an hour.  Keep all follow-up prenatal visits as told by your health care provider. This is important. Contact a health care provider if:  You have a fever.  You have continuous pain in your abdomen. Get help right away if:  Your contractions become stronger, more regular, and closer together.  You have fluid leaking or gushing from your vagina.  You pass blood-tinged mucus (bloody show).  You have bleeding from your vagina.  You have low back pain that you never had before.  You feel your baby's head pushing down and causing pelvic pressure.  Your  baby is not moving inside you as much as it used to. Summary  Contractions that occur before labor are called Braxton Hicks contractions, false labor, or practice contractions.  Braxton Hicks contractions are usually shorter, weaker, farther apart, and less regular than true labor contractions. True labor contractions usually become progressively stronger and regular, and they become more frequent.  Manage discomfort from [facility] contractions by changing position, resting in a warm bath, drinking plenty of water, or practicing deep breathing. This information is not intended to replace advice given to you by your health care provider. Make sure you discuss any questions you have with your health care provider. Document Released: 02/02/2017 Document Revised: 07/04/2017 Document Reviewed: 02/02/2017 Elsevier Interactive Patient Education  2019 [employer].

## 2019-02-26 NOTE — Progress Notes (Signed)
I connected with  Sunjai Ammar Albea on 02/26/19 at  3:55 PM EDT by telephone and verified that I am speaking with the correct person using two identifiers.   I discussed the limitations, risks, security and privacy concerns of performing an evaluation and management service by telephone and virtual by Webex and the availability of in person appointments. I also discussed with the patient that there may be a patient responsible charge related to this service. The patient expressed understanding and agreed to proceed.  Linda,RN 02/26/2019  3:55 PM

## 2019-02-26 NOTE — Telephone Encounter (Signed)
Attempted to call patient with her follow-up OB appointment ( 6/16 @ 1:55- office). No answer, left appointment information as well as information about wearing a face mask and no visitors are allowed. Reminder mailed

## 2019-03-18 ENCOUNTER — Telehealth: Payer: Self-pay | Admitting: Family Medicine

## 2019-03-18 NOTE — Telephone Encounter (Signed)
Patient was contacted with Interpreter ID [phone], patient was unable to be reached nor could leave a message about her appt change, the Interpreter even tried to call her Emer contact person and was unable to leave a message on that phone either.

## 2019-03-19 ENCOUNTER — Ambulatory Visit (INDEPENDENT_AMBULATORY_CARE_PROVIDER_SITE_OTHER): Payer: Medicaid Other | Admitting: Nurse Practitioner

## 2019-03-19 ENCOUNTER — Other Ambulatory Visit: Payer: Self-pay

## 2019-03-19 ENCOUNTER — Encounter: Payer: Medicaid Other | Admitting: Nurse Practitioner

## 2019-03-19 ENCOUNTER — Other Ambulatory Visit (HOSPITAL_COMMUNITY)
Admission: RE | Admit: 2019-03-19 | Discharge: 2019-03-19 | Disposition: A | Discharge status: Home / Self Care | Payer: Medicaid Other | Source: Ambulatory Visit | Attending: Student | Admitting: Student

## 2019-03-19 VITALS — BP 113/71 | HR 102 | Temp 98.2°F | Wt 160.3 lb

## 2019-03-19 DIAGNOSIS — Z3400 Encounter for supervision of normal first pregnancy, unspecified trimester: Secondary | ICD-10-CM | POA: Diagnosis not present

## 2019-03-19 DIAGNOSIS — O2243 Hemorrhoids in pregnancy, third trimester: Secondary | ICD-10-CM | POA: Insufficient documentation

## 2019-03-19 DIAGNOSIS — Z3A36 36 weeks gestation of pregnancy: Secondary | ICD-10-CM

## 2019-03-19 NOTE — Progress Notes (Signed)
    Subjective:  Sara George is a 33 y.o. G1P0 at [redacted]w[redacted]d being seen today for ongoing prenatal care.  She is currently monitored for the following issues for this low-risk pregnancy and has Supervision of low-risk first pregnancy; Anemia affecting pregnancy; Language barrier; and Hemorrhoids during pregnancy in third trimester on their problem list.  Interpreter present in person with client for the entire visit.  Patient reports hemorrhoids.  Contractions: Irritability. Vag. Bleeding: None.  Movement: Present. Denies leaking of fluid. Straining some with stools.  Taking stool softener every other day.  The following portions of the patient's history were reviewed and updated as appropriate: allergies, current medications, past family history, past medical history, past social history, past surgical history and problem list. Problem list updated.  Objective:   Vitals:   03/19/19 1027  BP: 113/71  Pulse: (!) 102  Temp: 98.2 F (36.8 C)  Weight: 160 lb 4.8 oz (72.7 kg)    Fetal Status: Fetal Heart Rate (bpm): 161   Movement: Present  Presentation: Vertex  General:  Alert, oriented and cooperative. Patient is in no acute distress.  Skin: Skin is warm and dry. No rash noted.   Cardiovascular: Normal heart rate noted  Respiratory: Normal respiratory effort, no problems with respiration noted  Abdomen: Soft, gravid, appropriate for gestational age. Pain/Pressure: Present     Pelvic:  Cervical exam performed Dilation: Closed   Station: -2  Cervix is posterior, no visitble hemorrhoid seen. GC/Chlam and GBS done  Extremities: Normal range of motion.  Edema: None  Mental Status: Normal mood and affect. Normal behavior. Normal judgment and thought content.   Urinalysis:      Assessment and Plan:  Pregnancy: G1P0 at [redacted]w[redacted]d  1. Encounter for supervision of low-risk first pregnancy, antepartum Reivewed PP contraception - Liletta IUD Reviewed entrance for [place] - GC/Chlamydia probe amp  [name] - Culture, beta strep (group b only)  2. Hemorrhoids during pregnancy in third trimester To use Preparation H and increase stool softener so that she is not having to strain having a BM.  Preterm labor symptoms and general obstetric precautions including but not limited to vaginal bleeding, contractions, leaking of fluid and fetal movement were reviewed in detail with the patient. Please refer to After Visit Summary for other counseling recommendations.  Return in about 1 week (around 03/26/2019) for webex visit.  Earlie Server, RN, MSN, NP-BC Nurse Practitioner, [facility] for [employer] 03/19/2019 11:14 AM

## 2019-03-19 NOTE — Patient Instructions (Signed)

## 2019-03-20 LAB — GC/CHLAMYDIA PROBE AMP (~~LOC~~) NOT AT ARMC
Chlamydia: NEGATIVE
Neisseria Gonorrhea: NEGATIVE

## 2019-03-23 LAB — CULTURE, BETA STREP (GROUP B ONLY): Strep Gp B Culture: POSITIVE — AB

## 2019-03-24 ENCOUNTER — Encounter: Payer: Self-pay | Admitting: Nurse Practitioner

## 2019-03-24 DIAGNOSIS — O99820 Streptococcus B carrier state complicating pregnancy: Secondary | ICD-10-CM | POA: Insufficient documentation

## 2019-03-25 ENCOUNTER — Telehealth: Payer: Self-pay | Admitting: Advanced Practice Midwife

## 2019-03-25 ENCOUNTER — Telehealth: Payer: Self-pay

## 2019-03-25 NOTE — Telephone Encounter (Signed)
Opened in error

## 2019-03-25 NOTE — Telephone Encounter (Signed)
Called the patient to inform due to changes in the office the appointment time is now 11:15am. The visit will remain as virtual. If the patient has any questions she can contact the office via the [phone].

## 2019-03-26 ENCOUNTER — Other Ambulatory Visit: Payer: Self-pay

## 2019-03-26 ENCOUNTER — Telehealth: Payer: Self-pay

## 2019-03-26 ENCOUNTER — Ambulatory Visit: Payer: Medicaid Other

## 2019-03-26 ENCOUNTER — Telehealth (INDEPENDENT_AMBULATORY_CARE_PROVIDER_SITE_OTHER): Payer: Medicaid Other | Admitting: Lactation Services

## 2019-03-26 DIAGNOSIS — O99820 Streptococcus B carrier state complicating pregnancy: Secondary | ICD-10-CM

## 2019-03-26 DIAGNOSIS — Z5329 Procedure and treatment not carried out because of patient's decision for other reasons: Secondary | ICD-10-CM

## 2019-03-26 DIAGNOSIS — Z91199 Patient's noncompliance with other medical treatment and regimen due to unspecified reason: Secondary | ICD-10-CM

## 2019-03-26 NOTE — Progress Notes (Signed)
Unable to reach patient for virtual visit. Will reschedule  Wende Mott, CNM 03/26/19 11:24 AM

## 2019-03-26 NOTE — Telephone Encounter (Signed)
Called pt with assistance of [employer]pre[employer] # [phone] to give her Group B Strep results. Pt did not answer.  Message was left for pt to call the office for her lab results.

## 2019-03-26 NOTE — Telephone Encounter (Signed)
Received a call from the patient who stated she had missed a call. After speaking to [facility], she stated the patient was called on several different times, and there was no answer. After trying to explain to her she had missed her appointment, and to just keep the next one for next week her daughter got on the phone. She was upset because her appointment was for the afternoon, and no one called her to inform her of this. I informed her that she was called about the change, and apologize for the miscommunication.

## 2019-03-26 NOTE — Telephone Encounter (Signed)
-----   Message from Virginia Rochester, NP sent at 03/24/2019 12:19 PM EDT ----- Message to clinical staff to call client and discuss results - will need interpreter.  Will get antibiotics in labor for treatment.

## 2019-03-26 NOTE — Progress Notes (Signed)
[place] interpreter @11am  LVM  @1106am  lvm to call to reschedule @1122am  LVM to call office to get new  appointment

## 2019-03-27 NOTE — Telephone Encounter (Signed)
Attempted to call pt on home phone using [employer] # [phone]. No answer. LM for pt to call the office for test results.

## 2019-03-27 NOTE — Telephone Encounter (Signed)
Pt sister called in to get results. Pt sister speaks fluent [place] and interpreted for pt. Informed sister that pt is + for Group B strep and will need to be treated during labor to prevent infant from contracting the Bacteria. Pt sister voiced understanding.

## 2019-04-01 ENCOUNTER — Encounter (HOSPITAL_COMMUNITY): Payer: Self-pay

## 2019-04-01 ENCOUNTER — Inpatient Hospital Stay (HOSPITAL_COMMUNITY)
Admission: AD | Admit: 2019-04-01 | Discharge: 2019-04-01 | Disposition: A | Discharge status: Home / Self Care | Payer: Medicaid Other | Attending: Obstetrics and Gynecology | Admitting: Obstetrics and Gynecology

## 2019-04-01 ENCOUNTER — Other Ambulatory Visit: Payer: Self-pay

## 2019-04-01 DIAGNOSIS — R51 Headache: Secondary | ICD-10-CM | POA: Diagnosis present

## 2019-04-01 DIAGNOSIS — O26893 Other specified pregnancy related conditions, third trimester: Secondary | ICD-10-CM | POA: Insufficient documentation

## 2019-04-01 DIAGNOSIS — Z3A38 38 weeks gestation of pregnancy: Secondary | ICD-10-CM | POA: Insufficient documentation

## 2019-04-01 DIAGNOSIS — R519 Headache, unspecified: Secondary | ICD-10-CM

## 2019-04-01 LAB — URINALYSIS, ROUTINE W REFLEX MICROSCOPIC
Bacteria, UA: NONE SEEN
Bilirubin Urine: NEGATIVE
Glucose, UA: NEGATIVE mg/dL
Hgb urine dipstick: NEGATIVE
Ketones, ur: NEGATIVE mg/dL
Nitrite: NEGATIVE
Protein, ur: NEGATIVE mg/dL
Specific Gravity, Urine: 1.003 — ABNORMAL LOW (ref 1.005–1.030)
pH: 6 (ref 5.0–8.0)

## 2019-04-01 LAB — CBC
HCT: 39.4 % (ref 36.0–46.0)
Hemoglobin: 12.7 g/dL (ref 12.0–15.0)
MCH: 28.6 pg (ref 26.0–34.0)
MCHC: 32.2 g/dL (ref 30.0–36.0)
MCV: 88.7 fL (ref 80.0–100.0)
Platelets: 190 10*3/uL (ref 150–400)
RBC: 4.44 MIL/uL (ref 3.87–5.11)
RDW: 14.7 % (ref 11.5–15.5)
WBC: 7.8 10*3/uL (ref 4.0–10.5)
nRBC: 0 % (ref 0.0–0.2)

## 2019-04-01 MED ORDER — MAGNESIUM 400 MG PO CAPS: 400.0000 mg | ORAL_CAPSULE | Freq: Every day | ORAL | 0 refills | Status: DC

## 2019-04-01 MED ORDER — METOCLOPRAMIDE HCL 5 MG/ML IJ SOLN
10.0000 mg | Freq: Once | INTRAMUSCULAR | Status: AC
  Administered 2019-04-01: 18:00:00 10 mg via INTRAVENOUS
  Filled 2019-04-01: qty 2

## 2019-04-01 MED ORDER — BUTALBITAL-APAP-CAFFEINE 50-325-40 MG PO TABS
2.0000 | ORAL_TABLET | Freq: Once | ORAL | Status: AC
  Administered 2019-04-01: 2 via ORAL
  Filled 2019-04-01: qty 2

## 2019-04-01 MED ORDER — DIPHENHYDRAMINE HCL 50 MG/ML IJ SOLN
25.0000 mg | Freq: Once | INTRAMUSCULAR | Status: AC
  Administered 2019-04-01: 25 mg via INTRAVENOUS
  Filled 2019-04-01: qty 1

## 2019-04-01 MED ORDER — METOCLOPRAMIDE HCL 10 MG PO TABS: 10.0000 mg | ORAL_TABLET | Freq: Four times a day (QID) | ORAL | 1 refills | Status: DC

## 2019-04-01 MED ORDER — DEXAMETHASONE SODIUM PHOSPHATE 10 MG/ML IJ SOLN
10.0000 mg | Freq: Once | INTRAMUSCULAR | Status: AC
  Administered 2019-04-01: 18:00:00 10 mg via INTRAVENOUS
  Filled 2019-04-01: qty 1

## 2019-04-01 MED ORDER — ACETAMINOPHEN 325 MG PO TABS
650.0000 mg | ORAL_TABLET | Freq: Once | ORAL | Status: AC
  Administered 2019-04-01: 16:00:00 650 mg via ORAL
  Filled 2019-04-01: qty 2

## 2019-04-01 MED ORDER — ONDANSETRON 4 MG PO TBDP
8.0000 mg | ORAL_TABLET | Freq: Once | ORAL | Status: AC
  Administered 2019-04-01: 16:00:00 8 mg via ORAL
  Filled 2019-04-01: qty 2

## 2019-04-01 MED ORDER — CYCLOBENZAPRINE HCL 10 MG PO TABS
10.0000 mg | ORAL_TABLET | Freq: Once | ORAL | Status: AC
  Administered 2019-04-01: 10 mg via ORAL
  Filled 2019-04-01: qty 1

## 2019-04-01 MED ORDER — LACTATED RINGERS IV SOLN
INTRAVENOUS | Status: DC
  Administered 2019-04-01: 18:00:00 via INTRAVENOUS

## 2019-04-01 MED ORDER — ACETAMINOPHEN 325 MG PO TABS: 650.0000 mg | ORAL_TABLET | Freq: Four times a day (QID) | ORAL | 1 refills | Status: DC | PRN

## 2019-04-01 MED ORDER — DIPHENHYDRAMINE HCL 25 MG PO TABS: 25.0000 mg | ORAL_TABLET | Freq: Four times a day (QID) | ORAL | 0 refills | Status: DC

## 2019-04-01 NOTE — MAU Note (Signed)
.   Sara George is a 33 y.o. at 108w1d here in MAU reporting: a bad headache since yesterday.   Onset of complaint: 03/31/19 Pain score:8 Vitals:   04/01/19 1431 04/01/19 1432  BP: 122/73   Pulse: 86   Resp: 16   Temp: 98 F (36.7 C) 98 F (36.7 C)     FHT:140 Lab orders placed from triage: UA

## 2019-04-01 NOTE — MAU Provider Note (Addendum)
Patient  Sara George is a 33 y.o. G1P0 At [redacted]w[redacted]d here with complaints of headache that started yesterday morning at 5 am. She has some contractions but is not here for this reason, although she would like a cervical exam.   She denies vaginal bleeding, dysuria, leaking of fluid, but she denies abnormal discharge, fever, SOB, body aches.   She feels like the baby has not been moving normally for the past three days.  She has not eaten today; she has been nauseated so she has not had an appetite.   She has no history of migraines but she does have a history of sinus headaches.  History     CSN: [id number]  Arrival date and time: 04/01/19 1410   First Provider Initiated Contact with Patient 04/01/19 1503      Chief Complaint  Patient presents with  . Headache   Headache  This is a new problem. The current episode started yesterday. The problem occurs constantly. The pain is located in the right unilateral region. The pain does not radiate. The quality of the pain is described as squeezing. Associated symptoms include ear pain, nausea and photophobia. Pertinent negatives include no blurred vision, drainage, fever, muscle aches, sore throat, visual change or vomiting. The symptoms are aggravated by bright light and coughing. She has tried darkened room for the symptoms.   Normally she has sinus heachaches on both sides of her head but this time the pain is on her right side only. 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   Patient denies any problems chewing, any grinding of her teeth, any recent injuries to her face and head, any toothache or jaw pain.   OB History    Gravida  1   Para      Term      Preterm      AB      Living        SAB      TAB      Ectopic      Multiple      Live Births              Past Medical History:  Diagnosis Date  . Medical history non-contributory     Past Surgical History:  Procedure Laterality Date  . NO PAST  SURGERIES      Family History  Problem Relation Age of Onset  . Diabetes Mother   . Hypertension Mother   . Stroke Father     Social History   Tobacco Use  . Smoking status: Never Smoker  . Smokeless tobacco: Never Used  Substance Use Topics  . Alcohol use: Never    Frequency: Never  . Drug use: Never    Allergies: No Known Allergies  Medications Prior to Admission  Medication Sig Dispense Refill Last Dose  . docusate sodium (COLACE) 100 MG capsule Take 1 capsule (100 mg total) by mouth 2 (two) times daily as needed for mild constipation. 20 capsule 11   . famotidine (PEPCID) 40 MG tablet Take 1 tablet (40 mg total) by mouth daily. 30 tablet 11   . ferrous sulfate 325 (65 FE) MG tablet Take 1 tablet (325 mg total) by mouth 2 (two) times daily with a meal. 60 tablet 11   . Prenatal Vit-Fe Fumarate-FA (PRENATAL VITAMIN PLUS LOW IRON) 27-1 MG TABS Take 1 tablet by mouth daily. 30 tablet 11     Review of Systems  Constitutional: Negative for fever.  HENT: Positive for ear pain. Negative for sore throat.   Eyes: Positive for photophobia. Negative for blurred vision.  Respiratory: Negative.   Cardiovascular: Negative.   Gastrointestinal: Positive for nausea. Negative for vomiting.  Genitourinary: Negative.   Musculoskeletal: Negative.   Neurological: Positive for headaches.  Hematological: Negative.   Psychiatric/Behavioral: Negative.    Physical Exam   Blood pressure 117/81, pulse 97, temperature 98 F (36.7 C), resp. rate 16, height 5\' 4"  (1.626 m), weight 73.9 kg, last menstrual period 07/08/2018.  Physical Exam  Constitutional: She is oriented to person, place, and time. She appears well-developed.  HENT:  Head: Normocephalic.  Neck: Normal range of motion.  Respiratory: Effort normal.  GI: Soft.  Musculoskeletal: Normal range of motion.  Neurological: She is alert and oriented to person, place, and time.  Skin: Skin is warm and dry.  Psychiatric: She has a  normal mood and affect.    MAU Course  Procedures  MDM -NST: 135 bpm, mod var, present acel, neg acels, small contractions on monitor. -CBC normal; no signs of infection.  -1804: Patient's headache now 3/10, was able to sleep for an hour in MAU; now desires discharge.   Assessment and Plan   1. Acute nonintractable headache, unspecified headache type    2. Patient stable for discharge with recommendation to keep ob appt tomorrow.   3.Discharge home with Tylenol, Benadryl, Reglan for HA, as well as magnesium supplement.   Charlesetta GaribaldiKathryn Lorraine Aldridge Krzyzanowski 04/01/2019, 3:03 PM

## 2019-04-01 NOTE — Discharge Instructions (Signed)
Cluster Headache °A cluster headache is a type of headache that causes deep, intense head pain. Cluster headaches can last from 15 minutes to 3 hours. They usually occur: °· On one side of the head. They may occur on the other side when a new cluster of headaches begins. °· Repeatedly over weeks to months. °· Several times a day. °· At the same time of day, often at night. °· More often in the fall and springtime. °What are the causes? °The cause of this condition is not known. °What increases the risk? °This condition is more likely to develop in: °· Males. °· People who drink alcohol. °· People who smoke or use products that contain nicotine or tobacco. °· People who take medicines that cause blood vessels to expand, such as nitroglycerin. °· People who take antihistamines. °What are the signs or symptoms? °Symptoms of this condition include: °· Severe pain on one side of the head that begins behind or around your eye or temple. °· Pain on one side of the head. °· Nausea. °· Sensitivity to light. °· Runny nose and nasal stuffiness. °· Sweaty, pale skin on the face. °· Droopy or swollen eyelid, eye redness, or tearing. °· Restlessness and agitation. °How is this diagnosed? °This condition may be diagnosed based on: °· Your symptoms. °· A physical exam. °Your health care provider may order tests to see if your headaches are caused by another medical condition. These tests may show that you do not have cluster headaches. Tests may include: °· A CT scan of your head. °· An MRI of your head. °· Lab tests. °How is this treated? °This condition may be treated with: °· Medicines to relieve pain and to prevent repeated (recurrent) attacks. Some people may need a combination of medicines. °· Oxygen. This helps to relieve pain. °Follow these instructions at home: °Headache diary °Keep a headache diary as told by your health care provider. Doing this can help you and your health care provider figure out what triggers your  headaches. In your headache diary, include information about: °· The time of day that your headache started and what you were doing when it began. °· How long your headache lasted. °· Where your pain started and whether it moved to other areas. °· The type of pain, such as burning, stabbing, throbbing, or cramping. °· Your level of pain. Use a pain scale and rate the pain with a number from 1 (mild) up to 10 (severe). °· The treatment that you used, and any change in symptoms after treatment. ° °Medicines °· Take over-the-counter and prescription medicines only as told by your health care provider. °· Do not drive or use heavy machinery while taking prescription pain medicine. °· Use oxygen as told by your health care provider. °Lifestyle °· Follow a regular sleep schedule. Do not vary the time that you go to bed or the amount that you sleep from day to day. It is important to stay on the same schedule during a cluster period to help prevent headaches. °· Exercise regularly. °· Eat a healthy diet and avoid foods that may trigger your headaches. °· Avoid alcohol. °· Do not use any products that contain nicotine or tobacco, such as cigarettes and e-cigarettes. If you need help quitting, ask your health care provider. °Contact a health care provider if: °· Your headaches change, become more severe, or occur more often. °· The medicine or oxygen that your health care provider recommended does not help. °Get help right away   if: °· You faint. °· You have weakness or numbness, especially on one side of your body or face. °· You have double vision. °· You have nausea or vomiting that does not go away within several hours. °· You have trouble talking, walking, or keeping your balance. °· You have pain or stiffness in your neck. °· You have a fever. °Summary °· A cluster headache is a type of headache that causes deep, intense head pain, usually on one side of the head. °· Keep a headache diary to help discover what triggers  your headaches. °· A regular sleep schedule can help prevent headaches. °This information is not intended to replace advice given to you by your health care provider. Make sure you discuss any questions you have with your health care provider. °Document Released: 09/19/2005 Document Revised: 10/04/2017 Document Reviewed: 05/31/2016 °Elsevier Patient Education © 2020 Elsevier Inc. ° °

## 2019-04-02 ENCOUNTER — Encounter: Payer: Medicaid Other | Admitting: Advanced Practice Midwife

## 2019-04-02 ENCOUNTER — Ambulatory Visit (INDEPENDENT_AMBULATORY_CARE_PROVIDER_SITE_OTHER): Payer: Medicaid Other | Admitting: Advanced Practice Midwife

## 2019-04-02 ENCOUNTER — Encounter: Payer: Self-pay | Admitting: Advanced Practice Midwife

## 2019-04-02 VITALS — BP 108/71 | HR 81

## 2019-04-02 DIAGNOSIS — Z3A38 38 weeks gestation of pregnancy: Secondary | ICD-10-CM

## 2019-04-02 DIAGNOSIS — Z3400 Encounter for supervision of normal first pregnancy, unspecified trimester: Secondary | ICD-10-CM

## 2019-04-02 DIAGNOSIS — Z3403 Encounter for supervision of normal first pregnancy, third trimester: Secondary | ICD-10-CM

## 2019-04-02 NOTE — Progress Notes (Signed)
2:02p- Called pt using Arabic interpreter [place] id# [phone] from [facility], cm did not answer, left VM, advised will call back in 10 to 15 minutes.    2:19p- pt answered

## 2019-04-02 NOTE — Progress Notes (Signed)
   TELEHEALTH VIRTUAL OBSTETRICS VISIT ENCOUNTER NOTE  I connected with Rosmary Ammar Mignone on 04/02/19 at  2:15 PM EDT by telephone at home and verified that I am speaking with the correct person using two identifiers.   I discussed the limitations, risks, security and privacy concerns of performing an evaluation and management service by telephone and the availability of in person appointments. I also discussed with the patient that there may be a patient responsible charge related to this service. The patient expressed understanding and agreed to proceed.  Subjective:  Sara George is a 33 y.o. G1P0 at [redacted]w[redacted]d being followed for ongoing prenatal care.  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  Patient reports no complaints. Reports fetal movement. Denies any contractions, bleeding or leaking of fluid.   The following portions of the patient's history were reviewed and updated as appropriate: allergies, current medications, past family history, past medical history, past social history, past surgical history and problem list.   Objective:   General:  Alert, oriented and cooperative.   Mental Status: Normal mood and affect perceived. Normal judgment and thought content.  Rest of physical exam deferred due to type of encounter  Assessment and Plan:  Pregnancy: G1P0 at [redacted]w[redacted]d 1. Encounter for supervision of low-risk first pregnancy, antepartum - Routine Care  Term labor symptoms and general obstetric precautions including but not limited to vaginal bleeding, contractions, leaking of fluid and fetal movement were reviewed in detail with the patient.  I discussed the assessment and treatment plan with the patient. The patient was  provided an opportunity to ask questions and all were answered. The patient agreed with the plan and demonstrated an understanding of the instructions. The patient was advised to call back or seek an in-person office evaluation/go to MAU at [facility] for any urgent or concerning symptoms. Please refer to After Visit Summary for other counseling recommendations.   I provided 30 minutes of non-face-to-face time during this encounter.  int # [id number] used for this visit   Return in about 1 week (around 04/09/2019) for viritual visit .  Future Appointments  Date Time Provider [place]  04/09/2019  1:15 PM Danielle Rankin [facility]  04/16/2019  3:15 PM Rasch, Artist Pais, NP [facility]    Marcille Buffy DNP, CNM  04/02/19  2:35 PM  Center for [facility]

## 2019-04-03 ENCOUNTER — Encounter (HOSPITAL_COMMUNITY): Payer: Self-pay

## 2019-04-03 ENCOUNTER — Inpatient Hospital Stay (HOSPITAL_COMMUNITY)
Admission: AD | Admit: 2019-04-03 | Discharge: 2019-04-04 | Disposition: A | Discharge status: Home / Self Care | Payer: Medicaid Other | Attending: Obstetrics & Gynecology | Admitting: Obstetrics & Gynecology

## 2019-04-03 ENCOUNTER — Other Ambulatory Visit: Payer: Self-pay

## 2019-04-03 DIAGNOSIS — G43809 Other migraine, not intractable, without status migrainosus: Secondary | ICD-10-CM

## 2019-04-03 DIAGNOSIS — O26893 Other specified pregnancy related conditions, third trimester: Secondary | ICD-10-CM

## 2019-04-03 DIAGNOSIS — Z3A38 38 weeks gestation of pregnancy: Secondary | ICD-10-CM | POA: Insufficient documentation

## 2019-04-03 DIAGNOSIS — R51 Headache: Secondary | ICD-10-CM | POA: Insufficient documentation

## 2019-04-03 LAB — URINALYSIS, ROUTINE W REFLEX MICROSCOPIC
Bilirubin Urine: NEGATIVE
Glucose, UA: NEGATIVE mg/dL
Hgb urine dipstick: NEGATIVE
Ketones, ur: NEGATIVE mg/dL
Leukocytes,Ua: NEGATIVE
Nitrite: NEGATIVE
Protein, ur: NEGATIVE mg/dL
Specific Gravity, Urine: 1.004 — ABNORMAL LOW (ref 1.005–1.030)
pH: 7 (ref 5.0–8.0)

## 2019-04-03 MED ORDER — BUTALBITAL-APAP-CAFFEINE 50-325-40 MG PO TABS
2.0000 | ORAL_TABLET | Freq: Once | ORAL | Status: AC
  Administered 2019-04-04: 2 via ORAL
  Filled 2019-04-03: qty 2

## 2019-04-03 NOTE — MAU Note (Signed)
Pt reports to MAU c/o a HA on the right side of her head. Pt reports blurry vision. [t denies any RUQ pain. Pt reports abdominal pain as well that comes and goes every 3 hours. No LOF or bleeding. +FM.   Interpreter: tarek [phone]

## 2019-04-03 NOTE — MAU Provider Note (Signed)
Chief Complaint:  Headache   First Provider Initiated Contact with Patient 04/03/19 2336      HPI: Sara George is a 33 y.o. G1P0 at 70w3dwho presents to maternity admissions reporting headache since the evening.  No history of hypertension.   She reports good fetal movement, denies LOF, vaginal bleeding, vaginal itching/burning, urinary symptoms, h/a, dizziness, n/v, diarrhea, constipation or fever/chills.  She denies headache, visual changes or RUQ abdominal pain.  Arabic interpretor used  RN Note: Pt reports to MAU c/o a HA on the right side of her head. Pt reports blurry vision. [t denies any RUQ pain. Pt reports abdominal pain as well that comes and goes every 3 hours. No LOF or bleeding. +FM.    Past Medical History: Past Medical History:  Diagnosis Date  . Medical history non-contributory     Past obstetric history: OB History  Gravida Para Term Preterm AB Living  1            SAB TAB Ectopic Multiple Live Births               # Outcome Date GA Lbr Len/2nd Weight Sex Delivery Anes PTL Lv  1 Current             Past Surgical History: Past Surgical History:  Procedure Laterality Date  . NO PAST SURGERIES      Family History: Family History  Problem Relation Age of Onset  . Diabetes Mother   . Hypertension Mother   . Stroke Father     Social History: Social History   Tobacco Use  . Smoking status: Never Smoker  . Smokeless tobacco: Never Used  Substance Use Topics  . Alcohol use: Never    Frequency: Never  . Drug use: Never    Allergies: No Known Allergies  Meds:  Medications Prior to Admission  Medication Sig Dispense Refill Last Dose  . acetaminophen (TYLENOL) 325 MG tablet Take 2 tablets (650 mg total) by mouth every 6 (six) hours as needed. 20 tablet 1   . diphenhydrAMINE (BENADRYL) 25 MG tablet Take 1 tablet (25 mg total) by mouth every 6 (six) hours. 20 tablet 0   . docusate sodium (COLACE) 100 MG capsule Take 1 capsule (100 mg total) by  mouth 2 (two) times daily as needed for mild constipation. 20 capsule 11   . famotidine (PEPCID) 40 MG tablet Take 1 tablet (40 mg total) by mouth daily. 30 tablet 11   . ferrous sulfate 325 (65 FE) MG tablet Take 1 tablet (325 mg total) by mouth 2 (two) times daily with a meal. 60 tablet 11   . Magnesium 400 MG CAPS Take 400 mg by mouth daily. 20 capsule 0   . metoCLOPramide (REGLAN) 10 MG tablet Take 1 tablet (10 mg total) by mouth 4 (four) times daily. 20 tablet 1   . Prenatal Vit-Fe Fumarate-FA (PRENATAL VITAMIN PLUS LOW IRON) 27-1 MG TABS Take 1 tablet by mouth daily. 30 tablet 11     I have reviewed patient's Past Medical Hx, Surgical Hx, Family Hx, Social Hx, medications and allergies.   ROS:  Review of Systems  Constitutional: Negative for appetite change, chills and fever.  HENT: Negative for congestion, sinus pain and sore throat.   Eyes: Positive for visual disturbance. Negative for photophobia.  Respiratory: Negative for shortness of breath.   Gastrointestinal: Negative for abdominal pain, constipation, diarrhea and nausea.  Genitourinary: Negative for vaginal bleeding.  Musculoskeletal: Negative for myalgias.  Neurological: Positive  for headaches. Negative for dizziness, weakness, light-headedness and numbness.   Other systems negative  Physical Exam   Patient Vitals for the past 24 hrs:  BP Temp Temp src Pulse Resp  04/03/19 2226 127/78 98.1 F (36.7 C) Oral 77 18   Vitals:   04/04/19 0100 04/04/19 0110 04/04/19 0118 04/04/19 0320  BP: 107/79  (!) 109/45 111/75  Pulse: 79   81  Resp:    18  Temp:    98.2 F (36.8 C)  TempSrc:    Oral  SpO2: 100% 99%  100%    Constitutional: Well-developed, well-nourished female in no acute distress.   Speech normal No facial droop Cardiovascular: normal rate and rhythm Respiratory: normal effort, clear to auscultation bilaterally GI: Abd soft, non-tender, gravid appropriate for gestational age.   No rebound or guarding. MS:  Extremities nontender, no edema, normal ROM Neurologic: Alert and oriented x 4.   DTRs normal   No clonus GU: Neg CVAT.  PELVIC EXAM: deferred  FHT:  Baseline 140 , moderate variability, accelerations present, no decelerations Contractions: Irregular     Labs: Results for orders placed or performed during the hospital encounter of 04/03/19 (from the past 24 hour(s))  Urinalysis, Routine w reflex microscopic     Status: Abnormal   Collection Time: 04/03/19 11:03 PM  Result Value Ref Range   Color, Urine YELLOW YELLOW   APPearance CLEAR CLEAR   Specific Gravity, Urine 1.004 (L) 1.005 - 1.030   pH 7.0 5.0 - 8.0   Glucose, UA NEGATIVE NEGATIVE mg/dL   Hgb urine dipstick NEGATIVE NEGATIVE   Bilirubin Urine NEGATIVE NEGATIVE   Ketones, ur NEGATIVE NEGATIVE mg/dL   Protein, ur NEGATIVE NEGATIVE mg/dL   Nitrite NEGATIVE NEGATIVE   Leukocytes,Ua NEGATIVE NEGATIVE   B/Positive/-- (03/24 1335)  Imaging:  No results found.  MAU Course/MDM: I have ordered labs and reviewed results.  Urine is dilute. Well hydrated NST reviewed, reactive, category I   Irregular mild contractions Consult Dr Despina HiddenEure with presentation, exam findings and test results.  Treatments in MAU included Fioricet 2 tabs > brought headache from 9 to 7.  I added one tablet of Oxycodone IR 5mg  with further diminishing of pain to a 6.  Even though she rates this a 6, she states "it is much better".   Spoke with Dr Despina HiddenEure about whether I should try anything else. He states since she is normotensive, may discharge home with referral to headache specialist.  This is likely a migraine. .    Assessment: Single intrauterine pregnancy at 4433w4d Headache, likely migraine Normotensive  Plan: Discharge home Rx Fioricet for prn use at home for recurrent headache Referred to Headache specialist (message sent) Labor precautions and fetal kick counts Follow up in Office for prenatal visits and recheck  Encouraged to return here or to  other Urgent Care/ED if she develops worsening of symptoms, increase in pain, fever, or other concerning symptoms.   Pt stable at time of discharge.  Wynelle BourgeoisMarie Glenice Ciccone CNM, MSN Certified Nurse-Midwife 04/03/2019 11:36 PM

## 2019-04-04 DIAGNOSIS — G43809 Other migraine, not intractable, without status migrainosus: Secondary | ICD-10-CM | POA: Diagnosis not present

## 2019-04-04 DIAGNOSIS — O26893 Other specified pregnancy related conditions, third trimester: Secondary | ICD-10-CM | POA: Diagnosis not present

## 2019-04-04 DIAGNOSIS — R51 Headache: Secondary | ICD-10-CM | POA: Diagnosis present

## 2019-04-04 DIAGNOSIS — Z3A38 38 weeks gestation of pregnancy: Secondary | ICD-10-CM | POA: Diagnosis not present

## 2019-04-04 MED ORDER — BUTALBITAL-APAP-CAFFEINE 50-325-40 MG PO CAPS: 1.0000 | ORAL_CAPSULE | Freq: Four times a day (QID) | ORAL | 3 refills | Status: DC | PRN

## 2019-04-04 MED ORDER — OXYCODONE HCL 5 MG PO TABS
5.0000 mg | ORAL_TABLET | Freq: Once | ORAL | Status: AC
  Administered 2019-04-04: 5 mg via ORAL
  Filled 2019-04-04: qty 1

## 2019-04-04 NOTE — Discharge Instructions (Signed)
Recurrent Migraine Headache  Migraines are a type of headache, and they are usually stronger and more sudden than normal headaches (tension headaches). Migraines are characterized by an intense pulsing, throbbing pain that is usually only present on one side of the head. Sometimes, migraine headaches can cause nausea, vomiting, sensitivity to light and sound, and vision changes. Recurrent migraines keep coming back (recurring). A migraine can last from 4 hours up to 3 days. What are the causes? The exact cause of this condition is not known. However, a migraine may be caused when nerves in the brain become irritated and release chemicals that cause inflammation of blood vessels. This inflammation causes pain. Certain things may also trigger migraines, such as:  A disruption in your regular eating and sleeping schedule.  Smoking.  Stress.  Menstruation.  Certain foods and drinks, such as: ? Aged cheese. ? Chocolate. ? Alcohol. ? Caffeine. ? Foods or drinks that contain nitrates, glutamate, aspartame, MSG, or tyramine.  Lack of sleep.  Hunger.  Physical exertion.  Fatigue.  High altitude.  Weather changes.  Medicines, such as: ? Nitroglycerin, which is used to treat chest pain. ? Birth control pills. ? Estrogen. ? Some blood pressure medicines. What are the signs or symptoms? Symptoms of this condition vary for each person and may include:  Pain that is usually only present on one side of the head. In some cases, the pain may be on both sides of the head or around the head or neck.  Pulsating or throbbing pain.  Severe pain that prevents daily activities.  Pain that is aggravated by any physical activity.  Nausea, vomiting, or both.  Dizziness.  Pain with exposure to bright lights, loud noises, or activity.  General sensitivity to bright lights, loud noises, or smells. Before you get a migraine, you may get warning signs that a migraine is coming (aura). An aura  may include:  Seeing flashing lights.  Seeing bright spots, halos, or zigzag lines.  Having tunnel vision or blurred vision.  Having numbness or a tingling feeling.  Having trouble talking.  Having muscle weakness.  Smelling a certain odor. How is this diagnosed? This condition is often diagnosed based on:  Your symptoms and medical history.  A physical exam. You may also have tests, including:  A CT scan or MRI of your brain. These imaging tests cannot diagnose migraines, but they can help to rule out other causes of headaches.  Blood tests. How is this treated? This condition is treated with:  Medicines. These are used for: ? Lessening pain and nausea. ? Preventing recurrent migraines.  Lifestyle changes, such as changes to your diet or sleeping patterns.  Behavior therapy, such as relaxation training or biofeedback. Biofeedback is a treatment that involves teaching you to relax and use your brain to lower your heart rate and control your breathing. Follow these instructions at home: Medicines  Take over-the-counter and prescription medicines only as told by your health care provider.  Do not drive or use heavy machinery while taking prescription pain medicine. Lifestyle  Do not use any products that contain nicotine or tobacco, such as cigarettes and e-cigarettes. If you need help quitting, ask your health care provider.  Limit alcohol intake to no more than 1 drink a day for nonpregnant women and 2 drinks a day for men. One drink equals 12 oz of beer, 5 oz of wine, or 1 oz of hard liquor.  Get 7-9 hours of sleep each night, or the amount of   sleep recommended by your health care provider.  Limit your stress. Talk with your health care provider if you need help with stress management.  Maintain a healthy weight. If you need help losing weight, ask your health care provider.  Exercise regularly. Aim for 150 minutes of moderate-intensity exercise (walking,  biking, yoga) or 75 minutes of vigorous exercise (running, circuit training, swimming) each week. General instructions   Keep a journal to find out what triggers your migraine headaches so you can avoid these triggers. For example, write down: ? What you eat and drink. ? How much sleep you get. ? Any change to your diet or medicines.  Lie down in a dark, quiet room when you have a migraine.  Try placing a cool towel over your head when you have a migraine.  Keep lights dim, if bright lights bother you and make your migraines worse.  Keep all follow-up visits as told by your health care provider. This is important. Contact a health care provider if:  Your pain does not improve, even with medicine.  Your migraines continue to return, even with medicine.  You have a fever.  You have weight loss. Get help right away if:  Your migraine becomes severe and medicine does not help.  You have a stiff neck.  You have a loss of vision.  You have muscle weakness or loss of muscle control.  You start losing your balance or have trouble walking.  You feel faint or you pass out.  You develop new, severe symptoms.  You start having abrupt severe headaches that last for a second or less, like a thunderclap. Summary  Migraine headaches are usually stronger and more sudden than normal headaches (tension headaches). Migraines are characterized by an intense pulsing, throbbing pain that is usually only present on one side of the head.  The exact cause of this condition is not known. However, a migraine may be caused when nerves in the brain become irritated and release chemicals that cause inflammation of blood vessels.  Certain things may trigger migraines, such as changes to diet or sleeping patterns, smoking, certain foods, alcohol, stress, and certain medicines.  Sometimes, migraine headaches can cause nausea, vomiting, sensitivity to light and sound, and vision changes.  Migraines  are often diagnosed based on your symptoms, medical history, and a physical exam. This information is not intended to replace advice given to you by your health care provider. Make sure you discuss any questions you have with your health care provider. Document Released: 06/14/2001 Document Revised: 09/22/2017 Document Reviewed: 07/01/2016 Elsevier Patient Education  2020 Elsevier Inc.  

## 2019-04-08 ENCOUNTER — Telehealth: Payer: Self-pay | Admitting: Obstetrics & Gynecology

## 2019-04-08 NOTE — Telephone Encounter (Signed)
Called the patient to confirm the mychart visit scheduled for tomorrow. Informed the patient of being signed into the virtual visit 15 minutes prior to time. The patient verbalized understanding. °

## 2019-04-09 ENCOUNTER — Inpatient Hospital Stay (HOSPITAL_COMMUNITY)
Admission: AD | Admit: 2019-04-09 | Discharge: 2019-04-12 | DRG: 805 | Disposition: A | Discharge status: Home / Self Care | Payer: Medicaid Other | Attending: Obstetrics and Gynecology | Admitting: Obstetrics and Gynecology

## 2019-04-09 ENCOUNTER — Encounter (HOSPITAL_COMMUNITY): Payer: Self-pay | Admitting: *Deleted

## 2019-04-09 ENCOUNTER — Inpatient Hospital Stay (EMERGENCY_DEPARTMENT_HOSPITAL)
Admission: AD | Admit: 2019-04-09 | Discharge: 2019-04-09 | Disposition: A | Discharge status: Home / Self Care | Payer: Medicaid Other | Source: Home / Self Care | Attending: Obstetrics and Gynecology | Admitting: Obstetrics and Gynecology

## 2019-04-09 ENCOUNTER — Telehealth: Payer: Self-pay

## 2019-04-09 ENCOUNTER — Ambulatory Visit (INDEPENDENT_AMBULATORY_CARE_PROVIDER_SITE_OTHER): Payer: Medicaid Other | Admitting: Medical

## 2019-04-09 ENCOUNTER — Other Ambulatory Visit: Payer: Self-pay

## 2019-04-09 ENCOUNTER — Encounter: Payer: Self-pay | Admitting: Medical

## 2019-04-09 VITALS — BP 110/80 | HR 84

## 2019-04-09 DIAGNOSIS — Z3A39 39 weeks gestation of pregnancy: Secondary | ICD-10-CM

## 2019-04-09 DIAGNOSIS — Z789 Other specified health status: Secondary | ICD-10-CM

## 2019-04-09 DIAGNOSIS — O411230 Chorioamnionitis, third trimester, not applicable or unspecified: Secondary | ICD-10-CM | POA: Diagnosis present

## 2019-04-09 DIAGNOSIS — D649 Anemia, unspecified: Secondary | ICD-10-CM | POA: Diagnosis present

## 2019-04-09 DIAGNOSIS — O9902 Anemia complicating childbirth: Secondary | ICD-10-CM | POA: Diagnosis present

## 2019-04-09 DIAGNOSIS — Z3403 Encounter for supervision of normal first pregnancy, third trimester: Secondary | ICD-10-CM

## 2019-04-09 DIAGNOSIS — O471 False labor at or after 37 completed weeks of gestation: Secondary | ICD-10-CM | POA: Diagnosis not present

## 2019-04-09 DIAGNOSIS — O99019 Anemia complicating pregnancy, unspecified trimester: Secondary | ICD-10-CM | POA: Diagnosis present

## 2019-04-09 DIAGNOSIS — O4292 Full-term premature rupture of membranes, unspecified as to length of time between rupture and onset of labor: Principal | ICD-10-CM | POA: Diagnosis present

## 2019-04-09 DIAGNOSIS — O99824 Streptococcus B carrier state complicating childbirth: Secondary | ICD-10-CM | POA: Diagnosis present

## 2019-04-09 DIAGNOSIS — Z603 Acculturation difficulty: Secondary | ICD-10-CM | POA: Diagnosis present

## 2019-04-09 DIAGNOSIS — Z1159 Encounter for screening for other viral diseases: Secondary | ICD-10-CM

## 2019-04-09 DIAGNOSIS — O99820 Streptococcus B carrier state complicating pregnancy: Secondary | ICD-10-CM

## 2019-04-09 HISTORY — DX: Headache, unspecified: R51.9

## 2019-04-09 HISTORY — DX: Anemia, unspecified: D64.9

## 2019-04-09 NOTE — [facility] Provider Note (Signed)
I have communicated with Marlou Porch, CNM  and reviewed vital signs:  Vitals:   04/09/19 1822 04/09/19 1910  BP: 119/74 119/72  Pulse: 80   Resp: 18   Temp: 98.4 F (36.9 C)   SpO2: 100%     Vaginal exam:  Dilation: 1.5 Effacement (%): 70 Station: -2 Presentation: Vertex,   Also reviewed contraction pattern and that non-stress test is reactive.  It has been documented that patient is contracting irregularly and is 1.5 cm   not indicating active labor.  Patient denies any other complaints.  Based on this report provider has given order for discharge.  A discharge order and diagnosis entered by a provider.   Labor discharge instructions reviewed with patient.

## 2019-04-09 NOTE — Telephone Encounter (Signed)
Called pt using [place] id# [id number] from [employer]. To give her Induction date & time of 04/21/19 @ 7:30am at MAU entrance C. No answer left VM.

## 2019-04-09 NOTE — MAU Provider Note (Signed)
S: Ms. Sara George is a 33 y.o. G1P0 at [redacted]w[redacted]d  who presents to MAU today for labor evaluation.     Cervical exam by RN:  Dilation: 1.5 Effacement (%): 70 Station: -2 Presentation: Vertex  Fetal Monitoring: Baseline: 135 Variability: mod Accelerations: 15x15 Decelerations: none Contractions: Irreg, mild per RN.   MDM Discussed patient with RN. NST reviewed.   A: [facility] at [redacted]w[redacted]d  False labor  P: Discharge home Labor precautions and kick counts included in AVS Patient to follow-up with [facility] as scheduled  Patient may return to MAU as needed or when in [place], [place], [place] 04/09/2019 7:06 PM

## 2019-04-09 NOTE — Patient Instructions (Signed)
Fetal Movement Counts Patient Name: ________________________________________________ Patient Due Date: ____________________ What is a fetal movement count?  A fetal movement count is the number of times that you feel your baby move during a certain amount of time. This may also be called a fetal kick count. A fetal movement count is recommended for every pregnant woman. You may be asked to start counting fetal movements as early as week 28 of your pregnancy. Pay attention to when your baby is most active. You may notice your baby's sleep and wake cycles. You may also notice things that make your baby move more. You should do a fetal movement count:  When your baby is normally most active.  At the same time each day. A good time to count movements is while you are resting, after having something to eat and drink. How do I count fetal movements? 1. Find a quiet, comfortable area. Sit, or lie down on your side. 2. Write down the date, the start time and stop time, and the number of movements that you felt between those two times. Take this information with you to your health care visits. 3. For 2 hours, count kicks, flutters, swishes, rolls, and jabs. You should feel at least 10 movements during 2 hours. 4. You may stop counting after you have felt 10 movements. 5. If you do not feel 10 movements in 2 hours, have something to eat and drink. Then, keep resting and counting for 1 hour. If you feel at least 4 movements during that hour, you may stop counting. Contact a health care provider if:  You feel fewer than 4 movements in 2 hours.  Your baby is not moving like he or she usually does. Date: ____________ Start time: ____________ Stop time: ____________ Movements: ____________ Date: ____________ Start time: ____________ Stop time: ____________ Movements: ____________ Date: ____________ Start time: ____________ Stop time: ____________ Movements: ____________ Date: ____________ Start time:  ____________ Stop time: ____________ Movements: ____________ Date: ____________ Start time: ____________ Stop time: ____________ Movements: ____________ Date: ____________ Start time: ____________ Stop time: ____________ Movements: ____________ Date: ____________ Start time: ____________ Stop time: ____________ Movements: ____________ Date: ____________ Start time: ____________ Stop time: ____________ Movements: ____________ Date: ____________ Start time: ____________ Stop time: ____________ Movements: ____________ This information is not intended to replace advice given to you by your health care provider. Make sure you discuss any questions you have with your health care provider. Document Released: 10/19/2006 Document Revised: 10/09/2018 Document Reviewed: 10/29/2015 Elsevier Patient Education  2020 Elsevier Inc. Braxton Hicks Contractions Contractions of the uterus can occur throughout pregnancy, but they are not always a sign that you are in labor. You may have practice contractions called Braxton Hicks contractions. These false labor contractions are sometimes confused with true labor. What are Braxton Hicks contractions? Braxton Hicks contractions are tightening movements that occur in the muscles of the uterus before labor. Unlike true labor contractions, these contractions do not result in opening (dilation) and thinning of the cervix. Toward the end of pregnancy (32-34 weeks), Braxton Hicks contractions can happen more often and may become stronger. These contractions are sometimes difficult to tell apart from true labor because they can be very uncomfortable. You should not feel embarrassed if you go to the hospital with false labor. Sometimes, the only way to tell if you are in true labor is for your health care provider to look for changes in the cervix. The health care provider will do a physical exam and may monitor your contractions. If you   are not in true labor, the exam should show  that your cervix is not dilating and your water has not broken. If there are no other health problems associated with your pregnancy, it is completely safe for you to be sent home with false labor. You may continue to have Braxton Hicks contractions until you go into true labor. How to tell the difference between true labor and false labor True labor  Contractions last 30-70 seconds.  Contractions become very regular.  Discomfort is usually felt in the top of the uterus, and it spreads to the lower abdomen and low back.  Contractions do not go away with walking.  Contractions usually become more intense and increase in frequency.  The cervix dilates and gets thinner. False labor  Contractions are usually shorter and not as strong as true labor contractions.  Contractions are usually irregular.  Contractions are often felt in the front of the lower abdomen and in the groin.  Contractions may go away when you walk around or change positions while lying down.  Contractions get weaker and are shorter-lasting as time goes on.  The cervix usually does not dilate or become thin. Follow these instructions at home:   Take over-the-counter and prescription medicines only as told by your health care provider.  Keep up with your usual exercises and follow other instructions from your health care provider.  Eat and drink lightly if you think you are going into labor.  If Braxton Hicks contractions are making you uncomfortable: ? Change your position from lying down or resting to walking, or change from walking to resting. ? Sit and rest in a tub of warm water. ? Drink enough fluid to keep your urine pale yellow. Dehydration may cause these contractions. ? Do slow and deep breathing several times an hour.  Keep all follow-up prenatal visits as told by your health care provider. This is important. Contact a health care provider if:  You have a fever.  You have continuous pain in  your abdomen. Get help right away if:  Your contractions become stronger, more regular, and closer together.  You have fluid leaking or gushing from your vagina.  You pass blood-tinged mucus (bloody show).  You have bleeding from your vagina.  You have low back pain that you never had before.  You feel your baby's head pushing down and causing pelvic pressure.  Your baby is not moving inside you as much as it used to. Summary  Contractions that occur before labor are called Braxton Hicks contractions, false labor, or practice contractions.  Braxton Hicks contractions are usually shorter, weaker, farther apart, and less regular than true labor contractions. True labor contractions usually become progressively stronger and regular, and they become more frequent.  Manage discomfort from Braxton Hicks contractions by changing position, resting in a warm bath, drinking plenty of water, or practicing deep breathing. This information is not intended to replace advice given to you by your health care provider. Make sure you discuss any questions you have with your health care provider. Document Released: 02/02/2017 Document Revised: 09/01/2017 Document Reviewed: 02/02/2017 Elsevier Patient Education  2020 Elsevier Inc.  

## 2019-04-09 NOTE — Progress Notes (Signed)
I connected with Monike Ammar Schroepfer on 04/09/19 at  1:15 PM EDT by: WebEx and verified that I am speaking with the correct person using two identifiers.  Patient is located at home and provider is located at [facility]     The purpose of this virtual visit is to provide medical care while limiting exposure to the novel coronavirus. I discussed the limitations, risks, security and privacy concerns of performing an evaluation and management service by WebEx and the availability of in person appointments. I also discussed with the patient that there may be a patient responsible charge related to this service. By engaging in this virtual visit, you consent to the provision of healthcare.  Additionally, you authorize for your insurance to be billed for the services provided during this visit.  The patient expressed understanding and agreed to proceed.  The following staff members participated in the virtual visit:  Carver Fila, CMA    PRENATAL VISIT NOTE  Subjective:  Carlean Ammar Rockers is a 33 y.o. G1P0 at [redacted]w[redacted]d  for phone visit for ongoing prenatal care.  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  Patient reports spotting and cramping today.  Contractions: Irritability. Vag. Bleeding: None, Scant.  Movement: Present. Denies leaking of fluid.   The following portions of the patient's history were reviewed and updated as appropriate: allergies, current medications, past family history, past medical history, past social history, past surgical history and problem list.   Objective:   Vitals:   04/09/19 1328  BP: 110/80  Pulse: 84   Self-Obtained  Fetal Status:     Movement: Present     Assessment and Plan:  Pregnancy: G1P0 at [redacted]w[redacted]d 1.  Encounter for supervision of low-risk first pregnancy in third trimester - Doing well, but has noticed spotting today with mild abdominal cramping  - Reports good fetal movement  - Advised to go to MAU for further evaluation today   2. 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 Language barrier -  [place] interpreter used   Term labor symptoms and general obstetric precautions including but not limited to vaginal bleeding, contractions, leaking of fluid and fetal movement were reviewed in detail with the patient.  Return in about 1 week (around 04/16/2019) for LOB, NST/BPP, In-Person.  Future Appointments  Date Time Provider [place]  04/16/2019  3:15 PM Rasch, Artist Pais, NP [facility]    Time spent on virtual visit: 10 minutes  Kerry Hough, PA-C

## 2019-04-09 NOTE — Discharge Instructions (Signed)
Braxton Hicks Contractions Contractions of the uterus can occur throughout pregnancy, but they are not always a sign that you are in labor. You may have practice contractions called Braxton Hicks contractions. These false labor contractions are sometimes confused with true labor. What are Braxton Hicks contractions? Braxton Hicks contractions are tightening movements that occur in the muscles of the uterus before labor. Unlike true labor contractions, these contractions do not result in opening (dilation) and thinning of the cervix. Toward the end of pregnancy (32-34 weeks), Braxton Hicks contractions can happen more often and may become stronger. These contractions are sometimes difficult to tell apart from true labor because they can be very uncomfortable. You should not feel embarrassed if you go to the hospital with false labor. Sometimes, the only way to tell if you are in true labor is for your health care provider to look for changes in the cervix. The health care provider will do a physical exam and may monitor your contractions. If you are not in true labor, the exam should show that your cervix is not dilating and your water has not broken. If there are no other health problems associated with your pregnancy, it is completely safe for you to be sent home with false labor. You may continue to have Braxton Hicks contractions until you go into true labor. How to tell the difference between true labor and false labor True labor  Contractions last 30-70 seconds.  Contractions become very regular.  Discomfort is usually felt in the top of the uterus, and it spreads to the lower abdomen and low back.  Contractions do not go away with walking.  Contractions usually become more intense and increase in frequency.  The cervix dilates and gets thinner. False labor  Contractions are usually shorter and not as strong as true labor contractions.  Contractions are usually irregular.  Contractions  are often felt in the front of the lower abdomen and in the groin.  Contractions may go away when you walk around or change positions while lying down.  Contractions get weaker and are shorter-lasting as time goes on.  The cervix usually does not dilate or become thin. Follow these instructions at home:   Take over-the-counter and prescription medicines only as told by your health care provider.  Keep up with your usual exercises and follow other instructions from your health care provider.  Eat and drink lightly if you think you are going into labor.  If Braxton Hicks contractions are making you uncomfortable: ? Change your position from lying down or resting to walking, or change from walking to resting. ? Sit and rest in a tub of warm water. ? Drink enough fluid to keep your urine pale yellow. Dehydration may cause these contractions. ? Do slow and deep breathing several times an hour.  Keep all follow-up prenatal visits as told by your health care provider. This is important. Contact a health care provider if:  You have a fever.  You have continuous pain in your abdomen. Get help right away if:  Your contractions become stronger, more regular, and closer together.  You have fluid leaking or gushing from your vagina.  You pass blood-tinged mucus (bloody show).  You have bleeding from your vagina.  You have low back pain that you never had before.  You feel your baby's head pushing down and causing pelvic pressure.  Your baby is not moving inside you as much as it used to. Summary  Contractions that occur before labor are   called Braxton Hicks contractions, false labor, or practice contractions.  Braxton Hicks contractions are usually shorter, weaker, farther apart, and less regular than true labor contractions. True labor contractions usually become progressively stronger and regular, and they become more frequent.  Manage discomfort from Braxton Hicks contractions  by changing position, resting in a warm bath, drinking plenty of water, or practicing deep breathing. This information is not intended to replace advice given to you by your health care provider. Make sure you discuss any questions you have with your health care provider. Document Released: 02/02/2017 Document Revised: 09/01/2017 Document Reviewed: 02/02/2017 Elsevier Patient Education  2020 Elsevier Inc.  

## 2019-04-09 NOTE — MAU Note (Signed)
Pt was here earlier and was sent home because not in labor. At home felt SROM at home about 2200. She reports pain is 10/10. Was 1.5 cm when she left earlier.

## 2019-04-09 NOTE — MAU Note (Signed)
Having some contractions and a little bit of bleeding. Contractions are not consistent.  Will be strong and then they slow. Small amt of blood,mixed with d/c and mucous. Has not been checked.

## 2019-04-10 ENCOUNTER — Inpatient Hospital Stay (HOSPITAL_COMMUNITY): Payer: Medicaid Other | Admitting: Anesthesiology

## 2019-04-10 ENCOUNTER — Other Ambulatory Visit: Payer: Self-pay

## 2019-04-10 ENCOUNTER — Encounter (HOSPITAL_COMMUNITY): Payer: Self-pay

## 2019-04-10 DIAGNOSIS — Z1159 Encounter for screening for other viral diseases: Secondary | ICD-10-CM | POA: Diagnosis not present

## 2019-04-10 DIAGNOSIS — Z3A39 39 weeks gestation of pregnancy: Secondary | ICD-10-CM | POA: Diagnosis not present

## 2019-04-10 DIAGNOSIS — O411230 Chorioamnionitis, third trimester, not applicable or unspecified: Secondary | ICD-10-CM | POA: Diagnosis present

## 2019-04-10 DIAGNOSIS — O4292 Full-term premature rupture of membranes, unspecified as to length of time between rupture and onset of labor: Secondary | ICD-10-CM | POA: Diagnosis present

## 2019-04-10 DIAGNOSIS — O99824 Streptococcus B carrier state complicating childbirth: Secondary | ICD-10-CM | POA: Diagnosis present

## 2019-04-10 DIAGNOSIS — O4202 Full-term premature rupture of membranes, onset of labor within 24 hours of rupture: Secondary | ICD-10-CM | POA: Diagnosis not present

## 2019-04-10 DIAGNOSIS — O9902 Anemia complicating childbirth: Secondary | ICD-10-CM | POA: Diagnosis present

## 2019-04-10 DIAGNOSIS — D649 Anemia, unspecified: Secondary | ICD-10-CM | POA: Diagnosis present

## 2019-04-10 LAB — RPR: RPR Ser Ql: NONREACTIVE

## 2019-04-10 LAB — CBC
HCT: 37.8 % (ref 36.0–46.0)
Hemoglobin: 12.5 g/dL (ref 12.0–15.0)
MCH: 28.7 pg (ref 26.0–34.0)
MCHC: 33.1 g/dL (ref 30.0–36.0)
MCV: 86.7 fL (ref 80.0–100.0)
Platelets: 183 10*3/uL (ref 150–400)
RBC: 4.36 MIL/uL (ref 3.87–5.11)
RDW: 14.6 % (ref 11.5–15.5)
WBC: 8.8 10*3/uL (ref 4.0–10.5)
nRBC: 0 % (ref 0.0–0.2)

## 2019-04-10 LAB — TYPE AND SCREEN
ABO/RH(D): B POS
Antibody Screen: NEGATIVE

## 2019-04-10 LAB — SARS CORONAVIRUS 2 BY RT PCR (HOSPITAL ORDER, PERFORMED IN ~~LOC~~ HOSPITAL LAB): SARS Coronavirus 2: NEGATIVE

## 2019-04-10 LAB — POCT FERN TEST: POCT Fern Test: POSITIVE

## 2019-04-10 MED ORDER — SODIUM CHLORIDE 0.9 % IV SOLN
2.0000 g | Freq: Four times a day (QID) | INTRAVENOUS | Status: DC
  Administered 2019-04-10 (×3): 2 g via INTRAVENOUS
  Filled 2019-04-10 (×5): qty 2000

## 2019-04-10 MED ORDER — FENTANYL-BUPIVACAINE-NACL 0.5-0.125-0.9 MG/250ML-% EP SOLN
12.0000 mL/h | EPIDURAL | Status: DC | PRN
  Filled 2019-04-10 (×2): qty 250

## 2019-04-10 MED ORDER — LACTATED RINGERS AMNIOINFUSION
INTRAVENOUS | Status: AC
  Administered 2019-04-10: 10:00:00 via INTRAUTERINE

## 2019-04-10 MED ORDER — TETANUS-DIPHTH-ACELL PERTUSSIS 5-2.5-18.5 LF-MCG/0.5 IM SUSP: 0.5000 mL | Freq: Once | INTRAMUSCULAR | Status: DC

## 2019-04-10 MED ORDER — OXYCODONE-ACETAMINOPHEN 5-325 MG PO TABS: 1.0000 | ORAL_TABLET | ORAL | Status: DC | PRN

## 2019-04-10 MED ORDER — LIDOCAINE HCL (PF) 1 % IJ SOLN
30.0000 mL | INTRAMUSCULAR | Status: AC | PRN
  Administered 2019-04-10: 30 mL via SUBCUTANEOUS
  Filled 2019-04-10: qty 30

## 2019-04-10 MED ORDER — OXYTOCIN 40 UNITS IN NORMAL SALINE INFUSION - SIMPLE MED
2.5000 [IU]/h | INTRAVENOUS | Status: DC
  Administered 2019-04-10: 2.5 [IU]/h via INTRAVENOUS
  Filled 2019-04-10: qty 1000

## 2019-04-10 MED ORDER — METHYLERGONOVINE MALEATE 0.2 MG/ML IJ SOLN
INTRAMUSCULAR | Status: AC
  Filled 2019-04-10: qty 1

## 2019-04-10 MED ORDER — ACETAMINOPHEN 325 MG PO TABS: 650.0000 mg | ORAL_TABLET | ORAL | Status: DC | PRN

## 2019-04-10 MED ORDER — SODIUM CHLORIDE 0.9 % IV SOLN
5.0000 10*6.[IU] | Freq: Once | INTRAVENOUS | Status: AC
  Administered 2019-04-10: 5 10*6.[IU] via INTRAVENOUS
  Filled 2019-04-10: qty 5

## 2019-04-10 MED ORDER — LACTATED RINGERS IV SOLN
INTRAVENOUS | Status: DC
  Administered 2019-04-10: 05:00:00 via INTRAVENOUS

## 2019-04-10 MED ORDER — OXYCODONE-ACETAMINOPHEN 5-325 MG PO TABS: 2.0000 | ORAL_TABLET | ORAL | Status: DC | PRN

## 2019-04-10 MED ORDER — ONDANSETRON HCL 4 MG/2ML IJ SOLN: 4.0000 mg | INTRAMUSCULAR | Status: DC | PRN

## 2019-04-10 MED ORDER — ONDANSETRON HCL 4 MG/2ML IJ SOLN: 4.0000 mg | Freq: Four times a day (QID) | INTRAMUSCULAR | Status: DC | PRN

## 2019-04-10 MED ORDER — PENICILLIN G 3 MILLION UNITS IVPB - SIMPLE MED
3.0000 10*6.[IU] | INTRAVENOUS | Status: DC
  Administered 2019-04-10: 3 10*6.[IU] via INTRAVENOUS
  Filled 2019-04-10: qty 100

## 2019-04-10 MED ORDER — LACTATED RINGERS IV SOLN
500.0000 mL | Freq: Once | INTRAVENOUS | Status: AC
  Administered 2019-04-10: 500 mL via INTRAVENOUS

## 2019-04-10 MED ORDER — DIPHENHYDRAMINE HCL 50 MG/ML IJ SOLN: 12.5000 mg | INTRAMUSCULAR | Status: DC | PRN

## 2019-04-10 MED ORDER — COCONUT OIL OIL: 1.0000 "application " | TOPICAL_OIL | Status: DC | PRN

## 2019-04-10 MED ORDER — GENTAMICIN SULFATE 40 MG/ML IJ SOLN
5.0000 mg/kg | INTRAVENOUS | Status: DC
  Administered 2019-04-10: 310 mg via INTRAVENOUS
  Filled 2019-04-10 (×2): qty 7.75

## 2019-04-10 MED ORDER — METHYLERGONOVINE MALEATE 0.2 MG/ML IJ SOLN
0.2000 mg | Freq: Once | INTRAMUSCULAR | Status: AC
  Administered 2019-04-10: 0.2 mg via INTRAMUSCULAR

## 2019-04-10 MED ORDER — SOD CITRATE-CITRIC ACID 500-334 MG/5ML PO SOLN
30.0000 mL | ORAL | Status: DC | PRN
  Administered 2019-04-10: 30 mL via ORAL
  Filled 2019-04-10: qty 30

## 2019-04-10 MED ORDER — OXYTOCIN 40 UNITS IN NORMAL SALINE INFUSION - SIMPLE MED
1.0000 m[IU]/min | INTRAVENOUS | Status: DC
  Administered 2019-04-10: 2 m[IU]/min via INTRAVENOUS

## 2019-04-10 MED ORDER — TERBUTALINE SULFATE 1 MG/ML IJ SOLN: 0.2500 mg | Freq: Once | INTRAMUSCULAR | Status: DC | PRN

## 2019-04-10 MED ORDER — PHENYLEPHRINE 40 MCG/ML (10ML) SYRINGE FOR IV PUSH (FOR BLOOD PRESSURE SUPPORT)
80.0000 ug | PREFILLED_SYRINGE | INTRAVENOUS | Status: DC | PRN
  Filled 2019-04-10: qty 10

## 2019-04-10 MED ORDER — ACETAMINOPHEN 500 MG PO TABS
1000.0000 mg | ORAL_TABLET | Freq: Once | ORAL | Status: AC
  Administered 2019-04-10: 1000 mg via ORAL
  Filled 2019-04-10: qty 2

## 2019-04-10 MED ORDER — FLEET ENEMA 7-19 GM/118ML RE ENEM: 1.0000 | ENEMA | Freq: Every day | RECTAL | Status: DC | PRN

## 2019-04-10 MED ORDER — DIPHENHYDRAMINE HCL 25 MG PO CAPS: 25.0000 mg | ORAL_CAPSULE | Freq: Four times a day (QID) | ORAL | Status: DC | PRN

## 2019-04-10 MED ORDER — LACTATED RINGERS IV SOLN
500.0000 mL | INTRAVENOUS | Status: DC | PRN
  Administered 2019-04-10 (×2): 500 mL via INTRAVENOUS

## 2019-04-10 MED ORDER — OXYTOCIN BOLUS FROM INFUSION
500.0000 mL | Freq: Once | INTRAVENOUS | Status: AC
  Administered 2019-04-10: 500 mL via INTRAVENOUS

## 2019-04-10 MED ORDER — DIBUCAINE (PERIANAL) 1 % EX OINT
1.0000 "application " | TOPICAL_OINTMENT | CUTANEOUS | Status: DC | PRN
  Filled 2019-04-10: qty 28

## 2019-04-10 MED ORDER — PHENYLEPHRINE 40 MCG/ML (10ML) SYRINGE FOR IV PUSH (FOR BLOOD PRESSURE SUPPORT): 80.0000 ug | PREFILLED_SYRINGE | INTRAVENOUS | Status: DC | PRN

## 2019-04-10 MED ORDER — PRENATAL MULTIVITAMIN CH
1.0000 | ORAL_TABLET | Freq: Every day | ORAL | Status: DC
  Administered 2019-04-11 – 2019-04-12 (×2): 1 via ORAL
  Filled 2019-04-10 (×2): qty 1

## 2019-04-10 MED ORDER — ONDANSETRON HCL 4 MG PO TABS: 4.0000 mg | ORAL_TABLET | ORAL | Status: DC | PRN

## 2019-04-10 MED ORDER — EPHEDRINE 5 MG/ML INJ: 10.0000 mg | INTRAVENOUS | Status: DC | PRN

## 2019-04-10 MED ORDER — ZOLPIDEM TARTRATE 5 MG PO TABS: 5.0000 mg | ORAL_TABLET | Freq: Every evening | ORAL | Status: DC | PRN

## 2019-04-10 MED ORDER — ACETAMINOPHEN 325 MG PO TABS
650.0000 mg | ORAL_TABLET | ORAL | Status: DC | PRN
  Administered 2019-04-10 – 2019-04-11 (×2): 650 mg via ORAL
  Filled 2019-04-10 (×2): qty 2

## 2019-04-10 MED ORDER — ACETAMINOPHEN 500 MG PO TABS
1000.0000 mg | ORAL_TABLET | Freq: Four times a day (QID) | ORAL | Status: DC | PRN
  Administered 2019-04-10: 1000 mg via ORAL
  Filled 2019-04-10: qty 2

## 2019-04-10 MED ORDER — IBUPROFEN 600 MG PO TABS
600.0000 mg | ORAL_TABLET | Freq: Four times a day (QID) | ORAL | Status: DC
  Administered 2019-04-10 – 2019-04-12 (×8): 600 mg via ORAL
  Filled 2019-04-10 (×8): qty 1

## 2019-04-10 MED ORDER — LIDOCAINE HCL (PF) 1 % IJ SOLN
INTRAMUSCULAR | Status: DC | PRN
  Administered 2019-04-10: 11 mL via EPIDURAL

## 2019-04-10 MED ORDER — SODIUM CHLORIDE (PF) 0.9 % IJ SOLN
INTRAMUSCULAR | Status: DC | PRN
  Administered 2019-04-10: 09:00:00
  Administered 2019-04-10: 14 mL/h via EPIDURAL

## 2019-04-10 MED ORDER — WITCH HAZEL-GLYCERIN EX PADS: 1.0000 "application " | MEDICATED_PAD | CUTANEOUS | Status: DC | PRN

## 2019-04-10 MED ORDER — SENNOSIDES-DOCUSATE SODIUM 8.6-50 MG PO TABS
2.0000 | ORAL_TABLET | ORAL | Status: DC
  Administered 2019-04-10 – 2019-04-11 (×2): 2 via ORAL
  Filled 2019-04-10 (×2): qty 2

## 2019-04-10 MED ORDER — BENZOCAINE-MENTHOL 20-0.5 % EX AERO
1.0000 "application " | INHALATION_SPRAY | CUTANEOUS | Status: DC | PRN
  Administered 2019-04-10 – 2019-04-12 (×2): 1 via TOPICAL
  Filled 2019-04-10 (×2): qty 56

## 2019-04-10 MED ORDER — SIMETHICONE 80 MG PO CHEW: 80.0000 mg | CHEWABLE_TABLET | ORAL | Status: DC | PRN

## 2019-04-10 NOTE — Progress Notes (Signed)
LABOR PROGRESS NOTE  Sara George is a 33 y.o. G1P0 at [redacted]w[redacted]d  admitted for PROM.   Subjective: Doing well, feeling more pressure.   Objective: BP 111/61   Pulse 89   Temp 97.9 F (36.6 C) (Axillary)   Resp 18   Ht 5\' 4"  (1.626 m)   Wt 73.4 kg   LMP 07/08/2018 (Exact Date)   SpO2 100%   BMI 27.78 kg/m  or  Vitals:   04/10/19 0324 04/10/19 0331 04/10/19 0401 04/10/19 0431  BP:  (!) 112/53 95/79 111/61  Pulse:  82 85 89  Resp:    18  Temp: 97.9 F (36.6 C)     TempSrc: Axillary     SpO2:      Weight:      Height:        Dilation: 4 Effacement (%): 90 Station: -1 Presentation: Vertex Exam by:: TR.[email or web address] FHT: baseline rate 150, moderate varibility, +acel, -decel Toco: every 2-5 min   Labs: Lab Results  Component Value Date   WBC 8.8 04/10/2019   HGB 12.5 04/10/2019   HCT 37.8 04/10/2019   MCV 86.7 04/10/2019   PLT 183 04/10/2019    Patient Active Problem List   Diagnosis Date Noted  . Normal labor 04/10/2019  . Group B Streptococcus carrier state affecting pregnancy 03/24/2019  . Hemorrhoids during pregnancy in third trimester 03/19/2019  . Language barrier 12/27/2018  . Anemia affecting pregnancy 12/26/2018  . Supervision of low-risk first pregnancy 12/25/2018    Assessment / Plan: 33 y.o. G1P0 at [redacted]w[redacted]d here for PROM.   Labor: Minimal change since last evaluation, contractions spaced. Start pit 2x2.  Fetal Wellbeing:  Cat 1 strip  Pain Control:  Epidural placed  Anticipated MOD:  SVD   Darrelyn Hillock, D.O. Family Medicine PGY-2  04/10/2019, 5:13 AM

## 2019-04-10 NOTE — Progress Notes (Signed)
ANTIBIOTIC CONSULT NOTE - INITIAL  Pharmacy Consult for Gentamicin Indication: Chorioamnionitis  No Known Allergies  Patient Measurements: Height: 5\' 4"  (162.6 cm) Weight: 161 lb 13.1 oz (73.4 kg) IBW/kg (Calculated) : 54.7 Adjusted Body Weight: 62.2 kg  Vital Signs: Temp: 100.4 F (38 C) (07/08 0640) Temp Source: Axillary (07/08 0640) BP: 113/72 (07/08 0631) Pulse Rate: 96 (07/08 0631)  Labs: Recent Labs    04/10/19 0028  WBC 8.8  HGB 12.5  PLT 183   No results for input(s): GENTTROUGH, GENTPEAK, GENTRANDOM in the last 72 hours.   Microbiology: Recent Results (from the past 720 hour(s))  Culture, beta strep (group b only)     Status: Abnormal   Collection Time: 03/19/19 10:57 AM   Specimen: Vaginal/Rectal; Genital   VR  Result Value Ref Range Status   Strep Gp B Culture Positive (A) Negative Final    Comment: Centers for Disease Control and Prevention (CDC) and American Congress of Obstetricians and Gynecologists (ACOG) guidelines for prevention of perinatal group B streptococcal (GBS) disease specify co-collection of a vaginal and rectal swab specimen to maximize sensitivity of GBS detection. Per the CDC and ACOG, swabbing both the lower vagina and rectum substantially increases the yield of detection compared with sampling the vagina alone. Penicillin G, ampicillin, or cefazolin are indicated for intrapartum prophylaxis of perinatal GBS colonization. Reflex susceptibility testing should be performed prior to use of clindamycin only on GBS isolates from penicillin-allergic women who are considered a high risk for anaphylaxis. Treatment with vancomycin without additional testing is warranted if resistance to clindamycin is noted.   SARS Coronavirus 2 (CEPHEID - Performed in [facility] lab), Hosp Order     Status: None   Collection Time: 04/10/19 12:41 AM   Specimen: Nasopharyngeal Swab  Result Value Ref Range Status   SARS Coronavirus 2 NEGATIVE  NEGATIVE Final    Comment: (NOTE) If result is NEGATIVE SARS-CoV-2 target nucleic acids are NOT DETECTED. The SARS-CoV-2 RNA is generally detectable in upper and lower  respiratory specimens during the acute phase of infection. The lowest  concentration of SARS-CoV-2 viral copies this assay can detect is 250  copies / mL. A negative result does not preclude SARS-CoV-2 infection  and should not be used as the sole basis for treatment or other  patient management decisions.  A negative result may occur with  improper specimen collection / handling, submission of specimen other  than nasopharyngeal swab, presence of viral mutation(s) within the  areas targeted by this assay, and inadequate number of viral copies  (<250 copies / mL). A negative result must be combined with clinical  observations, patient history, and epidemiological information. If result is POSITIVE SARS-CoV-2 target nucleic acids are DETECTED. The SARS-CoV-2 RNA is generally detectable in upper and lower  respiratory specimens dur ing the acute phase of infection.  Positive  results are indicative of active infection with SARS-CoV-2.  Clinical  correlation with patient history and other diagnostic information is  necessary to determine patient infection status.  Positive results do  not rule out bacterial infection or co-infection with other viruses. If result is PRESUMPTIVE POSTIVE SARS-CoV-2 nucleic acids MAY BE PRESENT.   A presumptive positive result was obtained on the submitted specimen  and confirmed on repeat testing.  While 2019 novel coronavirus  (SARS-CoV-2) nucleic acids may be present in the submitted sample  additional confirmatory testing may be necessary for epidemiological  and / or clinical management purposes  to differentiate between  SARS-CoV-2  and other Sarbecovirus currently known to infect humans.  If clinically indicated additional testing with an alternate test  methodology [phone]) is  advised. The SARS-CoV-2 RNA is generally  detectable in upper and lower respiratory sp ecimens during the acute  phase of infection. The expected result is Negative. Fact Sheet for Patients:  StrictlyIdeas.no Fact Sheet for Healthcare Providers: BankingDealers.co.za This test is not yet approved or cleared by the [place] FDA and has been authorized for detection and/or diagnosis of SARS-CoV-2 by FDA under an Emergency Use Authorization (EUA).  This EUA will remain in effect (meaning this test can be used) for the duration of the COVID-19 declaration under Section 564(b)(1) of the Act, 21 U.S.C. section 360bbb-3(b)(1), unless the authorization is terminated or revoked sooner. Performed at [facility], [place] [address] [place], [place] [address]     Assessment: 33 y.o. female G1P0 at [redacted]w[redacted]d admitted for PROM  Goal of Therapy:  Gentamicin peak 6-8 mg/L and Trough < 1 mg/L  Plan:  Gentamicin 310 mg (5 mg/kg) mg IV every 24 hrs  Check Scr with next labs if gentamicin continued. Will check gentamicin levels if continued > 72hr or clinically indicated.   Thank you for allowing [place] to participate in this patients care. Jens Som, PharmD

## 2019-04-10 NOTE — H&P (Addendum)
LABOR AND DELIVERY ADMISSION HISTORY AND PHYSICAL NOTE  Sara George is a 33 y.o. female G1P0 with IUP at 6631w3d by LMP presenting for PROM around 10 pm on 7/7, contractions increasing afterwards.   She reports positive fetal movement. She denies vaginal bleeding.  Prenatal History/Complications: [facility], late to care at 26 weeks   Pregnancy complications:  - Anemia in pregnancy (however last Hgb 12.7 in 6/29)  - GBS positive  - Language barrier (Arabic)   Past Medical History: Past Medical History:  Diagnosis Date  . Medical history non-contributory     Past Surgical History: Past Surgical History:  Procedure Laterality Date  . NO PAST SURGERIES      Obstetrical History: OB History    Gravida  1   Para      Term      Preterm      AB      Living        SAB      TAB      Ectopic      Multiple      Live Births              Social History: Social History   Socioeconomic History  . Marital status: Married    Spouse name: ProofreaderMohamed  Alfatih  . Number of children: 0  . Years of education: Not on file  . Highest education level: Not on file  Occupational History    Comment: unemployed  Social Needs  . Financial resource strain: Not on file  . Food insecurity    Worry: Never true    Inability: Never true  . Transportation needs    Medical: No    Non-medical: No  Tobacco Use  . Smoking status: Never Smoker  . Smokeless tobacco: Never Used  Substance and Sexual Activity  . Alcohol use: Never    Frequency: Never  . Drug use: Never  . Sexual activity: Not Currently    Birth control/protection: None  Lifestyle  . Physical activity    Days per week: Not on file    Minutes per session: Not on file  . Stress: Not on file  Relationships  . Social Musicianconnections    Talks on phone: Not on file    Gets together: Not on file    Attends religious service: Not on file    Active member of club or organization: Not on file    Attends meetings  of clubs or organizations: Not on file    Relationship status: Not on file  Other Topics Concern  . Not on file  Social History Narrative  . Not on file    Family History: Family History  Problem Relation Age of Onset  . Diabetes Mother   . Hypertension Mother   . Stroke Father     Allergies: No Known Allergies  Medications Prior to Admission  Medication Sig Dispense Refill Last Dose  . ferrous sulfate 325 (65 FE) MG tablet Take 1 tablet (325 mg total) by mouth 2 (two) times daily with a meal. 60 tablet 11 04/09/2019 at Unknown time  . Prenatal Vit-Fe Fumarate-FA (PRENATAL VITAMIN PLUS LOW IRON) 27-1 MG TABS Take 1 tablet by mouth daily. 30 tablet 11 04/09/2019 at Unknown time  . Butalbital-APAP-Caffeine 50-325-40 MG capsule Take 1-2 capsules by mouth every 6 (six) hours as needed for headache. 30 capsule 3   . docusate sodium (COLACE) 100 MG capsule Take 1 capsule (100 mg total) by mouth  2 (two) times daily as needed for mild constipation. 20 capsule 11   . famotidine (PEPCID) 40 MG tablet Take 1 tablet (40 mg total) by mouth daily. 30 tablet 11      Review of Systems  All systems reviewed and negative except as stated in HPI  Physical Exam Blood pressure 115/76, pulse 90, temperature (!) 97.3 F (36.3 C), resp. rate 18, last menstrual period 07/08/2018, SpO2 100 %. General appearance: alert, oriented, NAD Lungs: normal respiratory effort Heart: regular rate Abdomen: soft, non-tender; gravid, FH appropriate for GA Extremities: No calf swelling or tenderness Presentation: cephalic Fetal monitoring: 150, mod var, + accel, - decel  Uterine activity: Every 2-3 min  Dilation: 2 Effacement (%): 50 Station: -3 Exam by:: Rosine Abe  Prenatal labs: ABO, Rh: B/Positive/-- (03/24 1335) Antibody: Negative (03/24 1335) Rubella: 15.10 (03/24 1335) RPR: Non Reactive (04/20 0839)  HBsAg: Negative (03/24 1335)  HIV: Non Reactive (04/20 0839)  GC/Chlamydia: negative  GBS:   Positive  2-hr GTT: Normal  Genetic screening: low risk NIPS  Anatomy US: Normal   Prenatal Transfer Tool  Maternal Diabetes: No Genetic Screening: too late  Maternal Ultrasounds/Referrals: Normal Fetal Ultrasounds or other Referrals:  None Maternal Substance Abuse:  No Significant Maternal Medications:  None Significant Maternal Lab Results: Group B Strep positive  No results found for this or any previous visit (from the past 24 hour(s)).  Patient Active Problem List   Diagnosis Date Noted  . Normal labor 04/10/2019  . Group B Streptococcus carrier state affecting pregnancy 03/24/2019  . Hemorrhoids during pregnancy in third trimester 03/19/2019  . Language barrier 12/27/2018  . Anemia affecting pregnancy 12/26/2018  . Supervision of low-risk first pregnancy 12/25/2018    Assessment: Sara George is a 33 y.o. G1P0 at [redacted]w[redacted]d here for PROM.   #Labor: Feeling contractions. Expectant management for now, will add on pit on next check if minimal to no change.  #Pain: Epidural placed  #FWB: Cat 1  #ID: GBS positive, PCN  #MOF: Breastfeeding  #MOC: Desires Liletta IUD, discussed post-placental placement, however patient wishes to wait until 4-6 week postpartum visit   #Circ: desires, outpatient   Patriciaann Clan 04/10/2019, 12:40 AM   OB FELLOW HISTORY AND PHYSICAL ATTESTATION  I have seen and examined this patient; I agree with above documentation in the resident's note.   Phill Myron, D.O. OB Fellow  04/10/2019, 7:59 AM

## 2019-04-10 NOTE — Anesthesia Preprocedure Evaluation (Signed)

## 2019-04-10 NOTE — Progress Notes (Signed)
LABOR PROGRESS NOTE  Sara George is a 33 y.o. G1P0 at [redacted]w[redacted]d  admitted for PROM.  Subjective: She is in bed lying on her left side. She is in mild discomfort with contractions otherwise she is doing well.  Objective: BP 120/69   Pulse 95   Temp (!) 101.9 F (38.8 C) (Oral)   Resp 20   Ht 5\' 4"  (1.626 m)   Wt 73.4 kg   LMP 07/08/2018 (Exact Date)   SpO2 100%   BMI 27.78 kg/m  or  Vitals:   04/10/19 1100 04/10/19 1130 04/10/19 1200 04/10/19 1205  BP: 129/74 127/69 120/69   Pulse: (!) 108 90 95   Resp: 18 18  20   Temp:  98.4 F (36.9 C)  (!) 101.9 F (38.8 C)  TempSrc:  Oral  Oral  SpO2:      Weight:      Height:         Dilation: 9 Effacement (%): 90 Station: Plus 1 Presentation: Vertex Exam by:: S moyer RN FHT: baseline rate 175bpm  , moderate varibility, 15 x15 acel, variable decels Toco: 1-2 mins  Labs: Lab Results  Component Value Date   WBC 8.8 04/10/2019   HGB 12.5 04/10/2019   HCT 37.8 04/10/2019   MCV 86.7 04/10/2019   PLT 183 04/10/2019    Patient Active Problem List   Diagnosis Date Noted  . Normal labor 04/10/2019  . Group B Streptococcus carrier state affecting pregnancy 03/24/2019  . Hemorrhoids during pregnancy in third trimester 03/19/2019  . Language barrier 12/27/2018  . Anemia affecting pregnancy 12/26/2018  . Supervision of low-risk first pregnancy 12/25/2018    Assessment / Plan: 33 y.o. G1P0 at [redacted]w[redacted]d here for PROM.  Labor: Continue with cervical exams. Give 1000cc IV fluid bolus. Continue pit at current rate of 6. Fetal Wellbeing:  Cat II Pain Control:  Epidural adequate Anticipated MOD: vaginal  Sara George, D.O. Family Medicine Resident, PGY-1  04/10/2019, 12:35 PM

## 2019-04-10 NOTE — Anesthesia Procedure Notes (Signed)
Epidural Patient location during procedure: OB Start time: 04/10/2019 1:31 AM End time: 04/10/2019 1:48 AM  Staffing Anesthesiologist: Lynda Rainwater, MD Performed: anesthesiologist   Preanesthetic Checklist Completed: patient identified, site marked, surgical consent, pre-op evaluation, timeout performed, IV checked, risks and benefits discussed and monitors and equipment checked  Epidural Patient position: sitting Prep: ChloraPrep Patient monitoring: heart rate, cardiac monitor, continuous pulse ox and blood pressure Approach: midline Location: L2-L3 Injection technique: LOR saline  Needle:  Needle type: Tuohy  Needle gauge: 17 G Needle length: 9 cm Needle insertion depth: 4 cm Catheter type: closed end flexible Catheter size: 20 Guage Catheter at skin depth: 8 cm Test dose: negative  Assessment Events: blood not aspirated, injection not painful, no injection resistance, negative IV test and no paresthesia  Additional Notes Reason for block:procedure for pain

## 2019-04-10 NOTE — Discharge Summary (Signed)
Obstetrics Discharge Summary OB/GYN Faculty Practice   Patient Name: Sara George DOB: 01/30/1986 MRN: [id number]  Date of admission: 04/09/2019 Delivering MD: Glenice Bow   Date of discharge: 04/12/2019  Admitting diagnosis: 40WKS WATER BROKE Intrauterine pregnancy: [redacted]w[redacted]d     Secondary diagnosis:   Principal Problem:   Normal labor Active Problems:   Anemia affecting pregnancy   Language barrier   Group B Streptococcus carrier state affecting pregnancy   Vaginal delivery  Additional problems:  . Triple I, Vacuum-Assisted Vaginal Delivery     Discharge diagnosis: Term Pregnancy Delivered                                            Postpartum procedures: None  Complications: Triple I, Vacuum-Assisted Delivery  Outpatient Follow-Up: [ ]  IUD placement   Hospital course: Sara George is a 33 y.o. [redacted]w[redacted]d who was admitted for PROM followed by [facility]. Her pregnancy was complicated by above noted. Her labor course was notable for augmentation with pitocin, Triple I with Tmax of 101 treated with amp/gent, epidural placement. Delivery was complicated by vacuum-assisted delivery given persistent Category II strip. Please see delivery/op note for additional details. Her postpartum course was uncomplicated. She was breastfeeding without difficulty. By day of discharge, she was passing flatus, urinating, eating and drinking without difficulty. Her pain was well-controlled, and she was discharged home with iBUPROFEN. She will follow-up in clinic in 4 weeks.   Physical exam  Vitals:   04/11/19 0500 04/11/19 0859 04/11/19 1858 04/12/19 0525  BP: 116/72  109/61 115/76  Pulse: 80  77 89  Resp: 16  20 18   Temp: 98 F (36.7 C) 97.8 F (36.6 C) 98.7 F (37.1 C) 98.7 F (37.1 C)  TempSrc: Oral Oral Oral Oral  SpO2:      Weight:      Height:       General: Well appearing, NAD  Lochia: appropriate Uterine Fundus: firm Incision: N/A DVT Evaluation: No evidence of DVT seen on physical  exam.  Labs: Lab Results  Component Value Date   WBC 18.8 (H) 04/11/2019   HGB 9.6 (L) 04/11/2019   HCT 28.8 (L) 04/11/2019   MCV 87.5 04/11/2019   PLT 152 04/11/2019   No flowsheet data found.  Discharge instructions: Per After Visit Summary and "Baby and Me Booklet"  After visit meds:  Allergies as of 04/12/2019   No Known Allergies     Medication List    STOP taking these medications   Butalbital-APAP-Caffeine 50-325-40 MG capsule   docusate sodium 100 MG capsule Commonly known as: Colace   famotidine 40 MG tablet Commonly known as: Pepcid     TAKE these medications   ferrous sulfate 325 (65 FE) MG tablet Take 1 tablet (325 mg total) by mouth 2 (two) times daily with a meal.   ibuprofen 600 MG tablet Commonly known as: ADVIL Take 1 tablet (600 mg total) by mouth every 6 (six) hours.   Prenatal Vitamin Plus Low Iron 27-1 MG Tabs Take 1 tablet by mouth daily.   senna-docusate 8.6-50 MG tablet Commonly known as: Senokot-S Take 2 tablets by mouth daily. Start taking on: April 13, 2019       Postpartum contraception: IUD Mirena Diet: Routine Diet Activity: Advance as tolerated. Pelvic rest for 6 weeks.   Follow-up Appt: Future Appointments  Date Time Provider [place]  04/16/2019  3:15 PM Rasch, Harolyn RutherfordJennifer I, NP [facility]   Follow-up Visit:No follow-ups on file. Please schedule this patient for Postpartum visit in: 4 weeks with the following provider: Any provider Low risk pregnancy complicated by: none Delivery mode:  Vacuum Anticipated Birth Control:  IUD PP Procedures needed: IUD  Schedule Integrated BH visit: no  Newborn Data: Live born female  Birth Weight: 6 lb 12.1 oz (3065 g) APGAR: 7, 8  Newborn Delivery   Birth date/time: 04/10/2019 14:22:00 Delivery type: Vaginal, Vacuum (Extractor)      Baby Feeding: Bottle and Breast Disposition:home with mother   Marcy Sirenatherine Wallace, D.O. [facility] Family [facility] Fellow, [facility]  [facility], [facility] 04/12/2019, 9:51 AM

## 2019-04-11 LAB — CBC
HCT: 28.8 % — ABNORMAL LOW (ref 36.0–46.0)
Hemoglobin: 9.6 g/dL — ABNORMAL LOW (ref 12.0–15.0)
MCH: 29.2 pg (ref 26.0–34.0)
MCHC: 33.3 g/dL (ref 30.0–36.0)
MCV: 87.5 fL (ref 80.0–100.0)
Platelets: 152 10*3/uL (ref 150–400)
RBC: 3.29 MIL/uL — ABNORMAL LOW (ref 3.87–5.11)
RDW: 15.1 % (ref 11.5–15.5)
WBC: 18.8 10*3/uL — ABNORMAL HIGH (ref 4.0–10.5)
nRBC: 0 % (ref 0.0–0.2)

## 2019-04-11 LAB — ABO/RH: ABO/RH(D): B POS

## 2019-04-11 NOTE — Lactation Note (Signed)
This note was copied from a baby's chart. Lactation Consultation Note Baby 28 hrs old. Mom is breast/formula feeding baby. Encouraged to BF first before giving supplement. Hand expression taught w/colostrum noted. Mom has short shaft nipple, easily expressed. Newborn behavior, STS, I&O, breast massage, supply and demand discussed. Heard intermittent swallows.  Mom has baby swaddled and wearing T-shirt. Encouraged to open t-shirt and un-swaddle to feed STS. MGM told LC mom's nipples were sore. Skin intact, no redness noted. Encouraged cheeks to breast and mouth open wide for deep latch. Assisted in stimulation to nipples to evert more. Lactation, feeding cues, and support group information given.  Patient Name: Sara George [id number] Date: 04/11/2019 Reason for consult: Initial assessment;1st time breastfeeding;Term   Maternal Data Has patient been taught Hand Expression?: Yes Does the patient have breastfeeding experience prior to this delivery?: No  Feeding Feeding Type: Breast Fed  LATCH Score    Audible Swallowing: A few with stimulation  Type of Nipple: Everted at rest and after stimulation  Comfort (Breast/Nipple): Soft / non-tender  Hold (Positioning): Assistance needed to correctly position infant at breast and maintain latch.     Interventions Interventions: Breast feeding basics reviewed;Adjust position;Assisted with latch;Support pillows;Skin to skin;Position options;Breast massage;Breast compression  Lactation Tools Discussed/Used     Consult Status Consult Status: Follow-up Date: 04/12/19 Follow-up type: In-patient    Theodoro Kalata 04/11/2019, 6:31 AM

## 2019-04-11 NOTE — Anesthesia Postprocedure Evaluation (Signed)
Anesthesia Post Note  Patient: Sara George  Procedure(s) Performed: AN AD HOC LABOR EPIDURAL     Patient location during evaluation: Mother Baby Anesthesia Type: Epidural Level of consciousness: awake Pain management: pain level controlled Vital Signs Assessment: post-procedure vital signs reviewed and stable Respiratory status: spontaneous breathing Cardiovascular status: stable Postop Assessment: no headache, epidural receding and able to ambulate Anesthetic complications: no Comments: Phone postop;spoke with pt cousin and then RN caring for pt to confirm information. Sl. Back pain reported at epidural site. Advised it should improve and to notify RN if it worsened.    Last Vitals:  Vitals:   04/11/19 0200 04/11/19 0500  BP: 110/62 116/72  Pulse: 82 80  Resp: 18 16  Temp: 37.2 C 36.7 C  SpO2:      Last Pain:  Vitals:   04/11/19 0715  TempSrc:   PainSc: Asleep   Pain Goal: Patients Stated Pain Goal: 3 (04/10/19 0106)                 Everette Rank

## 2019-04-11 NOTE — Progress Notes (Signed)
Post Partum Day #1 Subjective: up ad lib, voiding, tolerating PO, + flatus and patient complaining of soreness in genital area from stitches, low back pain, and hemorrhoidal pain.   Objective: Blood pressure 116/72, pulse 80, temperature 97.8 F (36.6 C), temperature source Oral, resp. rate 16, height 5\' 4"  (1.626 m), weight 73.4 kg, last menstrual period 07/08/2018, SpO2 100 %, unknown if currently breastfeeding.  Physical Exam:  General: alert, cooperative, appears stated age and no distress Lochia: appropriate Uterine Fundus: firm Incision: N/A DVT Evaluation: No evidence of DVT seen on physical exam. Negative Homan's sign.  Recent Labs    04/10/19 0028 04/11/19 0612  HGB 12.5 9.6*  HCT 37.8 28.8*    Assessment/Plan: Plan for discharge tomorrow, Breastfeeding, Lactation consult and Contraception IUD   LOS: 1 day    Katherine Basset, [place] for [employer], [place] [facility] 04/11/2019  10:50 AM

## 2019-04-12 MED ORDER — IBUPROFEN 600 MG PO TABS: 600.0000 mg | ORAL_TABLET | Freq: Four times a day (QID) | ORAL | 1 refills | Status: AC

## 2019-04-12 MED ORDER — SENNOSIDES-DOCUSATE SODIUM 8.6-50 MG PO TABS: 2.0000 | ORAL_TABLET | ORAL | 0 refills | Status: AC

## 2019-04-12 NOTE — Discharge Instructions (Signed)

## 2019-04-12 NOTE — Lactation Note (Signed)
This note was copied from a baby's chart. Lactation Consultation Note  Patient Name: Boy Tashica Provencio [id number] Date: 04/12/2019 Reason for consult: Follow-up assessment   LC Follow Up Visit:  Mother sleeping; will return later.  Grandmother awake and spoke with her about cluster feeding last night.               actation Tools Discussed/Used     Consult Status Consult Status: Follow-up Date: 04/12/19 Follow-up type: In-patient    Conrad Zajkowski R Walker Sitar 04/12/2019, 8:59 AM

## 2019-04-12 NOTE — Lactation Note (Signed)
This note was copied from a baby's chart. Lactation Consultation Note  Patient Name: Sara George RSWNI'O Date: 04/12/2019 Reason for consult: Follow-up assessment;Term;Primapara;1st time breastfeeding  P1 mother whose infant is now 45 hours old.  [place] interpreter [phone]) used for interpretation.  Baby was swaddled and asleep when I arrived.  Mother had no questions/concerns related to breast feeding.  Her feeding preference on admission was breast/bottle.  Mother's breasts are filling and she stated they are fuller today.  Discussed milk coming to volume.  Engorgement prevention/treatment reviewed.  Mother has a manual pump for home use.    She will continue to feed 8-12 times/24 hours or sooner if baby shows feeding cues.  She has our OP phone number for questions after discharge.  Mother does not have a DEBP for home use and does not need one at this time.  Coconut oil provided, at her request, for tender nipples.     Maternal Data Formula Feeding for Exclusion: Yes Reason for exclusion: Mother's choice to formula and breast feed on admission Has patient been taught Hand Expression?: Yes Does the patient have breastfeeding experience prior to this delivery?: No  Feeding    LATCH Score                   Interventions    Lactation Tools Discussed/Used     Consult Status Consult Status: Complete Date: 04/12/19 Follow-up type: Call as needed    Sybol Morre R Tecia Cinnamon 04/12/2019, 12:52 PM

## 2019-04-16 ENCOUNTER — Encounter: Payer: Medicaid Other | Admitting: Obstetrics and Gynecology

## 2019-04-18 ENCOUNTER — Inpatient Hospital Stay (HOSPITAL_COMMUNITY): Admit: 2019-04-18 | Discharge: 2019-04-18 | Disposition: A | Discharge status: Home / Self Care | Payer: Medicaid Other

## 2019-04-21 ENCOUNTER — Encounter (HOSPITAL_COMMUNITY): Payer: Medicaid Other

## 2019-04-22 ENCOUNTER — Telehealth: Payer: Self-pay | Admitting: Family Medicine

## 2019-04-22 NOTE — Telephone Encounter (Signed)
Patient want something for her hemorrhoids

## 2019-04-24 NOTE — Telephone Encounter (Signed)
Called patient with [place] interpreter [phone] stating I am returning her phone call. Patient is wanting to know what to take for hemorrhoids, she hasn't used the bathroom in 2 days and is already taking stool softeners. Told patient she can use miralax, anusol HC, preparation H or tucks pads. Also recommended she increase in her water & fiber intake. Patient verbalized understanding & had no questions.

## 2019-05-08 ENCOUNTER — Ambulatory Visit (INDEPENDENT_AMBULATORY_CARE_PROVIDER_SITE_OTHER): Payer: Medicaid Other | Admitting: Advanced Practice Midwife

## 2019-05-08 ENCOUNTER — Encounter: Payer: Self-pay | Admitting: Advanced Practice Midwife

## 2019-05-08 ENCOUNTER — Other Ambulatory Visit: Payer: Self-pay

## 2019-05-08 LAB — POCT PREGNANCY, URINE: Preg Test, Ur: NEGATIVE

## 2019-05-08 NOTE — Progress Notes (Signed)
Appointment Date: 05/08/2019  OBGYN Clinic: [facility]  Chief Complaint:  Postpartum Visit  History of Present Illness: Sara George is a 33 y.o. biracial G1P1001 (Patient's last menstrual period was 07/08/2018 (exact date).), seen for the above chief complaint. Her past medical history is significant for anemia    She is s/p vacuum-assisted vaginal delivery on 04/10/2019 at 39 weeks; she was discharged to home on PPD#2. Pregnancy complicated by none. Baby is doing well.  Complains of worried her stitches have not healed well. She is feeling pulling. She does not want to have IUD inserted today due to discomfort. She would like us to check them, and if not healed she would like to come back for IUD.   Vaginal bleeding or discharge: No  Mode of feeding infant: Breast Intercourse: No  Contraception: none, desires IUD today  PP depression s/s: No .  Any bowel or bladder issues: No  Pap smear: no abnormalities (date: 01/07/2019)  Review of Systems:  Her 12 point review of systems is negative or as noted in the History of Present Illness.  Patient Active Problem List   Diagnosis Date Noted  . Normal labor 04/10/2019  . Vaginal delivery 04/10/2019  . Group B Streptococcus carrier state affecting pregnancy 03/24/2019  . Hemorrhoids during pregnancy in third trimester 03/19/2019  . Language barrier 12/27/2018  . Anemia affecting pregnancy 12/26/2018  . Supervision of low-risk first pregnancy 12/25/2018    Medications Sara George had no medications administered during this visit. Current Outpatient Medications  Medication Sig Dispense Refill  . ibuprofen (ADVIL) 600 MG tablet Take 1 tablet (600 mg total) by mouth every 6 (six) hours. 60 tablet 1  . Prenatal Vit-Fe Fumarate-FA (PRENATAL VITAMIN PLUS LOW IRON) 27-1 MG TABS Take 1 tablet by mouth daily. 30 tablet 11  . senna-docusate (SENOKOT-S) 8.6-50 MG tablet Take 2 tablets by mouth daily. 20 tablet 0  . ferrous sulfate 325 (65 FE) MG  tablet Take 1 tablet (325 mg total) by mouth 2 (two) times daily with a meal. (Patient not taking: Reported on 05/08/2019) 60 tablet 11   No current facility-administered medications for this visit.     Allergies Patient has no known allergies.  Physical Exam:  Physical Exam  Constitutional: She is oriented to person, place, and time and well-developed, well-nourished, and in no distress. No distress.  Cardiovascular: Normal rate.  Pulmonary/Chest: Effort normal.  Abdominal: Soft. There is no abdominal tenderness. There is no rebound.  Genitourinary:    Genitourinary Comments: Some suture still visible inside the vagina. Otherwise well healed around the sutures. Patient does not want to have IUD inserted today due to discomfort from the sutures.    Neurological: She is alert and oriented to person, place, and time.  Skin: Skin is warm and dry.  Psychiatric: Affect normal.  Nursing note and vitals reviewed.    PP Depression Screening:   Edinburgh Postnatal Depression Scale - 05/08/19 1103      Edinburgh Postnatal Depression Scale:  In the Past 7 Days   I have been able to laugh and see the funny side of things.  0    I have looked forward with enjoyment to things.  0    I have blamed myself unnecessarily when things went wrong.  0    I have been anxious or worried for no good reason.  0    I have felt scared or panicky for no good reason.  0    Things have  been getting on top of me.  0    I have been so unhappy that I have had difficulty sleeping.  0    I have felt sad or miserable.  0    I have been so unhappy that I have been crying.  1    The thought of harming myself has occurred to me.  0    Edinburgh Postnatal Depression Scale Total  1       Assessment:Patient is a 33 y.o. G1P1001 who is 4 weeks postpartum from a vacuum-assisted vaginal delivery.  She is doing well.   Plan: 1. Postpartum care and examination      RTC 10 days for IUD insertion    Marcille Buffy DNP,  CNM  05/08/19  11:11 AM  [facility]

## 2019-05-21 ENCOUNTER — Other Ambulatory Visit: Payer: Self-pay

## 2019-05-21 ENCOUNTER — Ambulatory Visit (INDEPENDENT_AMBULATORY_CARE_PROVIDER_SITE_OTHER): Payer: Medicaid Other | Admitting: Medical

## 2019-05-21 ENCOUNTER — Encounter: Payer: Self-pay | Admitting: Medical

## 2019-05-21 VITALS — BP 116/82 | HR 83 | Ht 65.0 in | Wt 147.5 lb

## 2019-05-21 DIAGNOSIS — Z30430 Encounter for insertion of intrauterine contraceptive device: Secondary | ICD-10-CM

## 2019-05-21 LAB — POCT PREGNANCY, URINE: Preg Test, Ur: NEGATIVE

## 2019-05-21 MED ORDER — LEVONORGESTREL 19.5 MCG/DAY IU IUD
INTRAUTERINE_SYSTEM | Freq: Once | INTRAUTERINE | Status: AC
  Administered 2019-05-21: 16:00:00 via INTRAUTERINE

## 2019-05-21 NOTE — Patient Instructions (Signed)
Intrauterine Device Insertion An intrauterine device (IUD) is a medical device that gets inserted into the uterus to prevent pregnancy. It is a small, T-shaped device that has one or two nylon strings hanging down from it. The strings hang out of the lower part of the uterus (cervix) to allow for future IUD removal. There are two types of IUDs available:  Copper IUD. This type of IUD has copper wire wrapped around it. Copper makes the uterus and fallopian tubes produce a fluid that kills sperm. A copper IUD may last up to 10 years.  Hormone IUD. This type of IUD is made of plastic and contains the hormone progestin (synthetic progesterone). The hormone thickens mucus in the cervix and prevents sperm from entering the uterus. It also thins the uterine lining to prevent implantation of a fertilized egg. The hormone can weaken or kill the sperm that get into the uterus. A hormone IUD may last 3-5 years. Tell a health care provider about:  Any allergies you have.  All medicines you are taking, including vitamins, herbs, eye drops, creams, and over-the-counter medicines.  Any problems you or family members have had with anesthetic medicines.  Any blood disorders you have.  Any surgeries you have had.  Any medical conditions you have, including any STIs (sexually transmitted infections) you may have.  Whether you are pregnant or may be pregnant. What are the risks? Generally, this is a safe procedure. However, problems may occur, including:  Infection.  Bleeding.  Allergic reactions to medicines.  Accidental puncture (perforation) of the uterus, or damage to other structures or organs.  Accidental placement of the IUD either in the muscle layer of the uterus (myometrium) or outside the uterus.  The IUD falling out of the uterus (expulsion). This is more common among women who have recently had a child.  Pregnancy that happens in the fallopian tube (ectopic pregnancy).  Infection of  the uterus and fallopian tubes (pelvic inflammatory disease). What happens before the procedure?  Schedule the IUD insertion for when you will have your menstrual period or right after, to make sure you are not pregnant. Placement of the IUD is better tolerated shortly after a menstrual cycle.  Follow instructions from your health care provider about eating or drinking restrictions.  Ask your health care provider about changing or stopping your regular medicines. This is especially important if you are taking diabetes medicines or blood thinners.  You may get a pain reliever to take before the procedure.  You may have tests for: ? Pregnancy. A pregnancy test involves having a urine sample taken. ? STIs. Placing an IUD in someone who has an STI can make the infection worse. ? Cervical cancer. You may have a Pap test to check for this type of cancer. This means collecting cells from your cervix to be examined under a microscope.  You may have a physical exam to determine the size and position of your uterus. The procedure may vary among health care providers and hospitals. What happens during the procedure?  A tool (speculum) will be placed in your vagina and widened so that your health care provider can see your cervix.  Medicine may be applied to your cervix to help lower your risk of infection (antiseptic medicine).  You may be given an anesthetic medicine to numb each side of your cervix (intracervical block or paracervical block). This medicine is usually given by an injection into the cervix.  A tool (uterine sound) will be inserted into your   uterus to determine the length of your uterus and the direction that your uterus may be tilted.  A slim instrument or tube (IUD inserter) that holds the IUD will be inserted into your vagina, through your cervical canal, and into your uterus.  The IUD will be placed in the uterus, and the IUD inserter will be removed.  The strings that are  attached to the IUD will be trimmed so that they lie just below the cervix. The procedure may vary among health care providers and hospitals. What happens after the procedure?  You may have bleeding after the procedure. This is normal. It varies from light bleeding (spotting) for a few days to menstrual-like bleeding.  You may have cramping and pain.  You may feel dizzy or light-headed.  You may have lower back pain. Summary  An intrauterine device (IUD) is a small, T-shaped device that has one or two nylon strings hanging down from it.  Two types of IUDs are available. You may have a copper IUD or a hormone IUD.  Schedule the IUD insertion for when you will have your menstrual period or right after, to make sure you are not pregnant. Placement of the IUD is better tolerated shortly after a menstrual cycle.  You may have bleeding after the procedure. This is normal. It varies from light spotting for a few days to menstrual-like bleeding. This information is not intended to replace advice given to you by your health care provider. Make sure you discuss any questions you have with your health care provider. Document Released: 05/18/2011 Document Revised: 09/01/2017 Document Reviewed: 08/10/2016 Elsevier Patient Education  2020 [employer] IUD PLACEMENT POST-PROCEDURE INSTRUCTIONS  1. You may take Ibuprofen, Aleve or Tylenol for pain if needed.  Cramping should resolve within in 24 hours.  2. You may have a small amount of spotting.  You should wear a mini pad for the next few days.  3. You may have intercourse after 24 hours.  If you using this for birth control, it is effective immediately.  4. You need to call if you have any pelvic pain, fever, heavy bleeding or foul smelling vaginal discharge.  Irregular bleeding is common the first several months after having an IUD placed. You do not need to call for this reason unless you are concerned.  5. Shower or bathe as normal  6. You  should have a follow-up appointment in 4-8 weeks for a re-check to make sure you are not having any problems. 

## 2019-05-21 NOTE — Progress Notes (Signed)
   GYNECOLOGY CLINIC PROCEDURE NOTE  Ms. Sara George is a 33 y.o. G1P1001 here for [place] IUD insertion. No GYN concerns.  Last pap smear was on 01/07/19 and was normal.  IUD Insertion Procedure Note Patient identified, informed consent performed.  Discussed risks of irregular bleeding, cramping, infection, malpositioning or misplacement of the IUD outside the uterus which may require further procedure such as laparoscopy. Time out was performed.  Urine pregnancy test negative.  Speculum placed in the vagina.  Cervix visualized.  Cleaned with Betadine x 2.  Grasped anteriorly with a single tooth tenaculum.  Uterus sounded to 7 cm.  Liletta IUD placed per manufacturer's recommendations.  Strings trimmed to 3 cm. Tenaculum was removed, good hemostasis noted.  Patient tolerated procedure well.   Patient was given post-procedure instructions.  She was advised to be have backup contraception for one week.  Patient was also asked to check IUD strings periodically. She is moving out of town next week and will not be able to return for a routine string check. Warning signs for malposition were discussed.   Luvenia Redden, PA-C 05/21/2019 3:06 PM

## 2019-05-23 ENCOUNTER — Encounter: Payer: Self-pay | Admitting: General Practice
